# Patient Record
Sex: Male | Born: 1980
Health system: Southern US, Community
[De-identification: ages and names within clinical notes are randomized; demographics above are authoritative.]

## PROBLEM LIST (undated history)

## (undated) DIAGNOSIS — T7840XA Allergy, unspecified, initial encounter: Secondary | ICD-10-CM

## (undated) DIAGNOSIS — J45909 Unspecified asthma, uncomplicated: Secondary | ICD-10-CM

## (undated) HISTORY — DX: Allergy, unspecified, initial encounter: T78.40XA

## (undated) HISTORY — DX: Unspecified asthma, uncomplicated: J45.909

## (undated) HISTORY — PX: WISDOM TOOTH EXTRACTION: SHX21

---

## 2006-03-08 ENCOUNTER — Emergency Department (HOSPITAL_COMMUNITY): Admission: EM | Admit: 2006-03-08 | Discharge: 2006-03-08 | Payer: Self-pay | Admitting: Emergency Medicine

## 2008-09-23 ENCOUNTER — Emergency Department (HOSPITAL_COMMUNITY): Admission: EM | Admit: 2008-09-23 | Discharge: 2008-09-23 | Payer: Self-pay | Admitting: Family Medicine

## 2009-07-07 ENCOUNTER — Emergency Department (HOSPITAL_COMMUNITY): Admission: EM | Admit: 2009-07-07 | Discharge: 2009-07-07 | Payer: Self-pay | Admitting: Family Medicine

## 2009-07-17 ENCOUNTER — Emergency Department (HOSPITAL_COMMUNITY): Admission: EM | Admit: 2009-07-17 | Discharge: 2009-07-17 | Payer: Self-pay | Admitting: Family Medicine

## 2009-09-11 ENCOUNTER — Emergency Department (HOSPITAL_COMMUNITY): Admission: EM | Admit: 2009-09-11 | Discharge: 2009-09-11 | Payer: Self-pay | Admitting: Family Medicine

## 2010-11-30 LAB — GC/CHLAMYDIA PROBE AMP, GENITAL
Chlamydia, DNA Probe: NEGATIVE
GC Probe Amp, Genital: NEGATIVE

## 2010-12-12 LAB — GC/CHLAMYDIA PROBE AMP, GENITAL: GC Probe Amp, Genital: POSITIVE — AB

## 2012-12-30 ENCOUNTER — Ambulatory Visit (INDEPENDENT_AMBULATORY_CARE_PROVIDER_SITE_OTHER): Payer: PRIVATE HEALTH INSURANCE | Admitting: Physician Assistant

## 2012-12-30 VITALS — BP 112/64 | HR 73 | Temp 98.2°F | Resp 14 | Ht 72.5 in | Wt 186.0 lb

## 2012-12-30 DIAGNOSIS — L309 Dermatitis, unspecified: Secondary | ICD-10-CM

## 2012-12-30 DIAGNOSIS — L259 Unspecified contact dermatitis, unspecified cause: Secondary | ICD-10-CM

## 2012-12-30 DIAGNOSIS — J302 Other seasonal allergic rhinitis: Secondary | ICD-10-CM

## 2012-12-30 DIAGNOSIS — J45909 Unspecified asthma, uncomplicated: Secondary | ICD-10-CM

## 2012-12-30 DIAGNOSIS — J3089 Other allergic rhinitis: Secondary | ICD-10-CM

## 2012-12-30 DIAGNOSIS — J309 Allergic rhinitis, unspecified: Secondary | ICD-10-CM

## 2012-12-30 MED ORDER — BECLOMETHASONE DIPROPIONATE 80 MCG/ACT IN AERS: 1.0000 | INHALATION_SPRAY | RESPIRATORY_TRACT | Status: DC | PRN

## 2012-12-30 MED ORDER — FLUTICASONE PROPIONATE 50 MCG/ACT NA SUSP: 2.0000 | Freq: Every day | NASAL | Status: DC

## 2012-12-30 MED ORDER — CLOBETASOL PROPIONATE 0.05 % EX CREA: TOPICAL_CREAM | Freq: Two times a day (BID) | CUTANEOUS | Status: DC

## 2012-12-30 MED ORDER — ALBUTEROL SULFATE HFA 108 (90 BASE) MCG/ACT IN AERS: 2.0000 | INHALATION_SPRAY | RESPIRATORY_TRACT | Status: DC | PRN

## 2012-12-30 MED ORDER — BECLOMETHASONE DIPROPIONATE 80 MCG/ACT IN AERS: 2.0000 | INHALATION_SPRAY | Freq: Two times a day (BID) | RESPIRATORY_TRACT | Status: DC

## 2012-12-30 NOTE — Progress Notes (Signed)
  Subjective:    Patient ID: Isaac Estrada, male    DOB: 11-01-1980, 32 y.o.   MRN: [id number]  HPI This 32 y.o. male presents for evaluation of asthma and a rash. Presents today because his "asthma seemed real heavy today," and he noticed some bumps on his arms x 2 days.  Worsening asthma symptoms x 1 year.  He's been using other people's rescue inhalers as needed.  Needs rescue at least 2 x/week.  Wakes during the night needing rescue at least 1 x/week.  Sometimes "tries not to use it" even though he feels the need. Did not have insurance, so went without care for quite some time.  No new foods, meds, skin products, household products, pets, etc.  One of his daughters has a rash on her arms and legs.  Describes SOB, wheezing and chest tightness.  Past medical history, surgical history, family history, social history and problem list reviewed.  Review of Systems No chest pain, HA, dizziness, vision change, N/V, diarrhea, constipation, dysuria, urinary urgency or frequency, myalgias, arthralgias.     Objective:   Physical Exam Blood pressure 112/64, pulse 73, temperature 98.2 F (36.8 C), temperature source Oral, resp. rate 14, height 6' 0.5" (1.842 m), weight 186 lb (84.369 kg), SpO2 99.00%. Body mass index is 24.87 kg/(m^2). Well-developed, well nourished BM who is awake, alert and oriented, in NAD. HEENT: [place]/AT, PERRL, EOMI.  Sclera and conjunctiva are clear.  EAC are patent, TMs are normal in appearance. Nasal mucosa is pink and moist. OP is clear. Neck: supple, non-tender, no lymphadenopathy, thyromegaly. Heart: RRR, no murmur Lungs: normal effort, CTA Extremities: no cyanosis, clubbing or edema. Skin: warm and dry.  A few very small excoriated lesions on the inner aspect of each arm, including the antecubital fossae. Psychologic: good mood and appropriate affect, normal speech and behavior.  Office Spirometry Results: FEV1: 1.84 liters FVC: 3.95 liters FEV1/FVC: 46.6 % FVC  %  Predicted: 66 liters FEV % Predicted: 38 liters FeF 25-75: 0.98 liters FeF 25-75 % Predicted: 20     Assessment & Plan:  Asthma - Plan: albuterol (PROVENTIL HFA;VENTOLIN HFA) 108 (90 BASE) MCG/ACT inhaler, beclomethasone (QVAR) 80 MCG/ACT inhaler  Perennial allergic rhinitis with seasonal variation - Plan: fluticasone (FLONASE) 50 MCG/ACT nasal spray  Dermatitis - Plan: clobetasol cream (TEMOVATE) 0.05 %  Patient Instructions  It's important to control your allergies to help control your asthma. Please take all the medications as prescribed.     RTC 4 weeks.  Repeat spirometry.   Fernande Bras, PA-C Physician Assistant-Certified [facility]

## 2012-12-30 NOTE — Patient Instructions (Signed)
It's important to control your allergies to help control your asthma. Please take all the medications as prescribed.

## 2013-02-05 ENCOUNTER — Encounter: Payer: Self-pay | Admitting: Physician Assistant

## 2013-05-22 ENCOUNTER — Other Ambulatory Visit: Payer: Self-pay | Admitting: Physician Assistant

## 2013-06-18 ENCOUNTER — Ambulatory Visit (INDEPENDENT_AMBULATORY_CARE_PROVIDER_SITE_OTHER): Payer: PRIVATE HEALTH INSURANCE | Admitting: Family Medicine

## 2013-06-18 ENCOUNTER — Other Ambulatory Visit: Payer: Self-pay | Admitting: Physician Assistant

## 2013-06-18 VITALS — BP 110/72 | HR 80 | Temp 97.6°F | Resp 18 | Ht 72.5 in | Wt 187.0 lb

## 2013-06-18 DIAGNOSIS — J45909 Unspecified asthma, uncomplicated: Secondary | ICD-10-CM

## 2013-06-18 DIAGNOSIS — J45901 Unspecified asthma with (acute) exacerbation: Secondary | ICD-10-CM

## 2013-06-18 MED ORDER — FLUTICASONE-SALMETEROL 100-50 MCG/DOSE IN AEPB: 1.0000 | INHALATION_SPRAY | Freq: Two times a day (BID) | RESPIRATORY_TRACT | Status: DC

## 2013-06-18 MED ORDER — ALBUTEROL SULFATE HFA 108 (90 BASE) MCG/ACT IN AERS: 2.0000 | INHALATION_SPRAY | RESPIRATORY_TRACT | Status: DC | PRN

## 2013-06-18 NOTE — Progress Notes (Signed)
Patient ID: Isaac Estrada, male   DOB: 1980/10/02, 32 y.o.   MRN: [id number]  @[facility] @  Patient ID: Isaac Estrada MRN: [id number], DOB: [birth year], 32 y.o. Date of Encounter: 06/18/2013, 1:08 PM This chart was scribed for Elvina Sidle, MD by Valera Castle, ED Scribe. This patient was seen in room 9 and the patient's care was started at 1:08 PM.   Primary Physician: No PCP Per Patient  Chief Complaint: Asthma  HPI: 32 y.o. year old male with h/o asthma with history below presents with a sudden, moderate asthma flare up, onset earlier this morning. He reports using the last two puffs of his inhaler, with no relief. He reports using a nebulizer afterwards, from his son. He reports he has been using his inhaler every day. He states the attacks usually ocurr in the morning. He reports taking Qvar, with no relief.  He denies any other associated symptoms.   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    Past Medical History  Diagnosis Date   Allergy    Asthma      Home Meds: Prior to Admission medications   Medication Sig Start Date End Date Taking? Authorizing Provider  albuterol (VENTOLIN HFA) 108 (90 BASE) MCG/ACT inhaler Inhale 2 puffs into the lungs every 4 (four) hours as needed for wheezing or shortness of breath. Need office visit for additional refills. 05/22/13  Yes Chelle S Jeffery, PA-C  fluticasone (FLONASE) 50 MCG/ACT nasal spray Place 2 sprays into the nose daily. 12/30/12  Yes Chelle S Jeffery, PA-C  beclomethasone (QVAR) 80 MCG/ACT inhaler Inhale 2 puffs into the lungs 2 (two) times daily. 12/30/12   Chelle S Jeffery, PA-C  clobetasol cream (TEMOVATE) 0.05 % Apply topically 2 (two) times daily. 12/30/12   Chelle Tessa Lerner, PA-C    Allergies: No Known Allergies  History   Social History   Marital Status: Single    Spouse Name: n/a    Number of Children: 5   Years of Education: 14   Occupational History   unemployed    Social History Main Topics    Smoking status: Never Smoker    Smokeless tobacco: Never Used   Alcohol Use: No   Drug Use: No   Sexual Activity: Yes   Other Topics Concern   Not on file   Social History Narrative   Lives with his mother, step-father.  He shares custody of his 5 children.     Review of Systems: Constitutional: negative for chills, fever, night sweats, weight changes, or fatigue  HEENT: negative for vision changes, hearing loss, congestion, rhinorrhea, ST, epistaxis, or sinus pressure Cardiovascular: negative for chest pain or palpitations Respiratory: negative for hemoptysis, wheezing, or cough Positive for SOB Abdominal: negative for abdominal pain, nausea, vomiting, diarrhea, or constipation Dermatological: negative for rash Neurologic: negative for headache, dizziness, or syncope All other systems reviewed and are otherwise negative with the exception to those above and in the HPI.   Physical Exam: Blood pressure 110/72, pulse 80, temperature 97.6 F (36.4 C), temperature source Oral, resp. rate 18, height 6' 0.5" (1.842 m), weight 187 lb (84.823 kg), SpO2 98.00%., Body mass index is 25 kg/(m^2). General: Well developed, well nourished, in no acute distress. Head: Normocephalic, atraumatic, eyes without discharge, sclera non-icteric, nares are without discharge. Bilateral auditory canals clear, TM's are without perforation, pearly grey and translucent with reflective cone of light bilaterally. Oral cavity moist, posterior pharynx without exudate, erythema, peritonsillar abscess, or post nasal drip.  Neck: Supple. No  thyromegaly. Full ROM. No lymphadenopathy. Lungs: Clear bilaterally to auscultation without wheezes, rales, or rhonchi. Breathing is unlabored. Heart: RRR with S1 S2. No murmurs, rubs, or gallops appreciated. Abdomen: Soft, non-tender, non-distended with normoactive bowel sounds. No hepatomegaly. No rebound/guarding. No obvious abdominal masses. Msk:  Strength and tone normal  for age. Extremities/Skin: Warm and dry. No clubbing or cyanosis. No edema. No rashes or suspicious lesions. Neuro: Alert and oriented X 3. Moves all extremities spontaneously. Gait is normal. CNII-XII grossly in tact. Psych:  Responds to questions appropriately with a normal affect.   Labs: No orders of the defined types were placed in this encounter.     ASSESSMENT AND PLAN:  32 y.o. year old male with Asthma with acute exacerbation - Plan: Fluticasone-Salmeterol (ADVAIR) 100-50 MCG/DOSE AEPB, albuterol (VENTOLIN HFA) 108 (90 BASE) MCG/ACT inhaler     Signed, Elvina Sidle, MD 06/18/2013 1:08 PM

## 2013-06-18 NOTE — Patient Instructions (Signed)

## 2013-06-18 NOTE — Progress Notes (Signed)
Is a 32 year old gentleman with long history of asthma. He's using his inhaler every day now and it's not working as of last night and this morning. He's never taken Advair but he has failed the Qvar treatment. Patient does car detailing.  Over the course of the morning his torso breath has improved.  Objective: No acute distress Few expiratory wheezes HEENT unremarkable  Assessment: Chronic persistent asthma, acute exacerbation today Plan:Asthma with acute exacerbation - Plan: Fluticasone-Salmeterol (ADVAIR) 100-50 MCG/DOSE AEPB, albuterol (VENTOLIN HFA) 108 (90 BASE) MCG/ACT inhaler  Signed, Elvina Sidle, MD

## 2014-06-29 ENCOUNTER — Other Ambulatory Visit: Payer: Self-pay | Admitting: Family Medicine

## 2014-12-31 ENCOUNTER — Other Ambulatory Visit: Payer: Self-pay | Admitting: Physician Assistant

## 2015-01-24 ENCOUNTER — Encounter: Payer: Self-pay | Admitting: Emergency Medicine

## 2015-01-24 ENCOUNTER — Emergency Department
Admission: EM | Admit: 2015-01-24 | Discharge: 2015-01-24 | Disposition: A | Discharge status: Home / Self Care | Payer: Self-pay | Source: Home / Self Care | Attending: Emergency Medicine | Admitting: Emergency Medicine

## 2015-01-24 DIAGNOSIS — J4521 Mild intermittent asthma with (acute) exacerbation: Secondary | ICD-10-CM

## 2015-01-24 MED ORDER — ALBUTEROL SULFATE HFA 108 (90 BASE) MCG/ACT IN AERS: 2.0000 | INHALATION_SPRAY | Freq: Four times a day (QID) | RESPIRATORY_TRACT | Status: DC | PRN

## 2015-01-24 MED ORDER — ALBUTEROL SULFATE HFA 108 (90 BASE) MCG/ACT IN AERS: INHALATION_SPRAY | RESPIRATORY_TRACT | Status: DC

## 2015-01-24 NOTE — ED Notes (Signed)
Password for STD follow up [place] 5

## 2015-01-24 NOTE — ED Notes (Signed)
Patient states he is out of his asthma inhaler and request to be tested for STD's

## 2015-01-24 NOTE — ED Provider Notes (Signed)
CSN: [id number]     Arrival date & time 01/24/15  1241 History   First MD Initiated Contact with Patient 01/24/15 1329     Chief Complaint  Patient presents with  . Asthma  . SEXUALLY TRANSMITTED DISEASE   (Consider location/radiation/quality/duration/timing/severity/associated sxs/prior Treatment) Patient is a 34 y.o. male presenting with asthma. The history is provided by the patient. No language interpreter was used.  Asthma This is a recurrent problem. The current episode started more than 1 week ago. The problem occurs constantly. The problem has not changed since onset.Pertinent negatives include no chest pain. Nothing aggravates the symptoms. Nothing relieves the symptoms. He has tried nothing for the symptoms.  Pt is out of albuterol.   Pt also request test for gc and ct.   Pt had condom break.   He has no discharge but has had some tingling  Past Medical History  Diagnosis Date  . Allergy   . Asthma    History reviewed. No pertinent past surgical history. Family History  Problem Relation Age of Onset  . Diabetes Mother   . Asthma Daughter   . Asthma Son   . Asthma Daughter    History  Substance Use Topics  . Smoking status: Never Smoker   . Smokeless tobacco: Never Used  . Alcohol Use: No    Review of Systems  Cardiovascular: Negative for chest pain.  All other systems reviewed and are negative.   Allergies  Review of patient's allergies indicates no known allergies.  Home Medications   Prior to Admission medications   Medication Sig Start Date End Date Taking? Authorizing Provider  albuterol (VENTOLIN HFA) 108 (90 BASE) MCG/ACT inhaler INHALE 2 PUFFS INTO THE LUNGS EVERY 4 HOURS AS NEEDED FOR WHEEZING OR SHORTNESS OF BREATH...    "NEEDS OFFICE VISIT FOR ADDITIONAL REFILLS" 06/29/14   Porfirio Oarhelle Jeffery, PA-C  beclomethasone (QVAR) 80 MCG/ACT inhaler Inhale 2 puffs into the lungs 2 (two) times daily. 12/30/12   Chelle Jeffery, PA-C  clobetasol cream (TEMOVATE) 0.05 %  Apply topically 2 (two) times daily. 12/30/12   Chelle Jeffery, PA-C  fluticasone (FLONASE) 50 MCG/ACT nasal spray Place 2 sprays into the nose daily. 12/30/12   Chelle Jeffery, PA-C  Fluticasone-Salmeterol (ADVAIR) 100-50 MCG/DOSE AEPB Inhale 1 puff into the lungs 2 (two) times daily. 06/18/13   Elvina SidleKurt Lauenstein, MD   BP 143/76 mmHg  Pulse 87  Temp(Src) 98 F (36.7 C) (Oral)  Resp 16  Ht 6' (1.829 m)  Wt 181 lb 12 oz (82.441 kg)  BMI 24.64 kg/m2  SpO2 99% Physical Exam  Constitutional: He is oriented to person, place, and time. He appears well-developed and well-nourished.  HENT:  Head: Normocephalic.  Eyes: Conjunctivae and EOM are normal. Pupils are equal, round, and reactive to light.  Neck: Normal range of motion.  Cardiovascular: Normal rate.   Pulmonary/Chest: Effort normal and breath sounds normal.  Abdominal: He exhibits no distension.  Musculoskeletal: Normal range of motion.  Neurological: He is alert and oriented to person, place, and time.  Psychiatric: He has a normal mood and affect.  Nursing note and vitals reviewed.   ED Course  Procedures (including critical care time) Labs Review Labs Reviewed  URINE CYTOLOGY ANCILLARY ONLY    Imaging Review No results found.   MDM   1. Asthma with acute exacerbation, mild intermittent   albuterol Will hold on std treatment pending results. AVS    Elson AreasLeslie K Jakhai Fant, PA-C 01/24/15 1336  Lonia SkinnerLeslie K TopekaSofia, PA-C 01/24/15  1337 

## 2015-01-24 NOTE — Discharge Instructions (Signed)

## 2015-01-26 LAB — GC/CHLAMYDIA PROBE AMP, URINE
CHLAMYDIA, SWAB/URINE, PCR: NEGATIVE
GC PROBE AMP, URINE: NEGATIVE

## 2015-01-27 ENCOUNTER — Telehealth: Payer: Self-pay | Admitting: Emergency Medicine

## 2015-08-20 ENCOUNTER — Other Ambulatory Visit: Payer: Self-pay | Admitting: Physician Assistant

## 2015-11-11 ENCOUNTER — Ambulatory Visit (INDEPENDENT_AMBULATORY_CARE_PROVIDER_SITE_OTHER): Payer: Self-pay | Admitting: Family Medicine

## 2015-11-11 VITALS — BP 118/78 | HR 81 | Temp 98.8°F | Ht 72.5 in | Wt 169.0 lb

## 2015-11-11 DIAGNOSIS — J4540 Moderate persistent asthma, uncomplicated: Secondary | ICD-10-CM

## 2015-11-11 MED ORDER — FLUTICASONE PROPIONATE HFA 110 MCG/ACT IN AERO: 2.0000 | INHALATION_SPRAY | Freq: Two times a day (BID) | RESPIRATORY_TRACT | Status: DC

## 2015-11-11 MED ORDER — ALBUTEROL SULFATE HFA 108 (90 BASE) MCG/ACT IN AERS: INHALATION_SPRAY | RESPIRATORY_TRACT | Status: DC

## 2015-11-11 NOTE — Progress Notes (Signed)
   Subjective:    Patient ID: Isaac Estrada, male    DOB: 02-13-1981, 35 y.o.   MRN: [id number]  HPI This is a pleasant 35 yo male who presents today for refill of his asthma medication. He has been out of long acting and has been using his mother's up to several times a day. Has had asthma since childhood.   Past Medical History  Diagnosis Date  . Allergy   . Asthma    No past surgical history on file. Family History  Problem Relation Age of Onset  . Diabetes Mother   . Asthma Daughter   . Asthma Son   . Asthma Daughter    Social History  Substance Use Topics  . Smoking status: Never Smoker   . Smokeless tobacco: Never Used  . Alcohol Use: No    Review of Systems No fever, no chills, no cough, occasional wheeze and chest tightness    Objective:   Physical Exam Physical Exam  Constitutional: Oriented to person, place, and time. He appears well-developed and well-nourished.  HENT:  Head: Normocephalic and atraumatic.  Eyes: Conjunctivae are normal.  Neck: Normal range of motion. Neck supple.  Cardiovascular: Normal rate, regular rhythm and normal heart sounds.   Pulmonary/Chest: Effort normal and breath sounds normal.  Musculoskeletal: Normal range of motion.  Neurological: Alert and oriented to person, place, and time.  Skin: Skin is warm and dry.  Psychiatric: Normal mood and affect. Behavior is normal. Judgment and thought content normal.  Vitals reviewed.  BP 118/78 mmHg  Pulse 81  Temp(Src) 98.8 F (37.1 C) (Oral)  Ht 6' 0.5" (1.842 m)  Wt 169 lb (76.658 kg)  BMI 22.59 kg/m2  SpO2 98%  PF 450 best, 70% predicted (648)     Assessment & Plan:  1. Asthma, moderate persistent, uncomplicated - Provided written and verbal information regarding diagnosis and treatment. - discussed importance of decreasing lung inflammation and having better control and asthma - fluticasone (FLOVENT HFA) 110 MCG/ACT inhaler; Inhale 2 puffs into the lungs 2 (two) times daily.   Dispense: 1 Inhaler; Refill: 12 - albuterol (VENTOLIN HFA) 108 (90 Base) MCG/ACT inhaler; INHALE 2 PUFFS INTO THE LUNGS EVERY 4 HOURS AS NEEDED FOR WHEEZING OR SHORTNESS OF BREATH  Dispense: 18 g; Refill: 3 - follow up 4-8 weeks, sooner if worsening symptoms  Olean Reeeborah Gessner, FNP-BC  [facility]  11/11/2015 10:51 AM

## 2015-11-11 NOTE — Patient Instructions (Addendum)
IF you received an x-ray today, you will receive an invoice from [facility]. Please contact [facility] at [phone] with questions or concerns regarding your invoice.   IF you received labwork today, you will receive an invoice from [facility]. Please contact [facility] at [phone] with questions or concerns regarding your invoice.   Our billing staff will not be able to assist you with questions regarding bills from these companies.  You will be contacted with the lab results as soon as they are available. The fastest way to get your results is to activate your My Chart account. Instructions are located on the last page of this paperwork. If you have not heard from us regarding the results in 2 weeks, please contact this office.  Please come in for an asthma recheck in 4-8 weeks, sooner if worsening symptoms  Asthma, Adult Asthma is a recurring condition in which the airways tighten and narrow. Asthma can make it difficult to breathe. It can cause coughing, wheezing, and shortness of breath. Asthma episodes, also called asthma attacks, range from minor to life-threatening. Asthma cannot be cured, but medicines and lifestyle changes can help control it. CAUSES Asthma is believed to be caused by inherited (genetic) and environmental factors, but its exact cause is unknown. Asthma may be triggered by allergens, lung infections, or irritants in the air. Asthma triggers are different for each person. Common triggers include:   Animal dander.  Dust mites.  Cockroaches.  Pollen from trees or grass.  Mold.  Smoke.  Air pollutants such as dust, household cleaners, hair sprays, aerosol sprays, paint fumes, strong chemicals, or strong odors.  Cold air, weather changes, and winds (which increase molds and pollens in the air).  Strong emotional expressions such as crying or laughing hard.  Stress.  Certain medicines (such as aspirin) or types  of drugs (such as beta-blockers).  Sulfites in foods and drinks. Foods and drinks that may contain sulfites include dried fruit, potato chips, and sparkling grape juice.  Infections or inflammatory conditions such as the flu, a cold, or an inflammation of the nasal membranes (rhinitis).  Gastroesophageal reflux disease (GERD).  Exercise or strenuous activity. SYMPTOMS Symptoms may occur immediately after asthma is triggered or many hours later. Symptoms include:  Wheezing.  Excessive nighttime or early morning coughing.  Frequent or severe coughing with a common cold.  Chest tightness.  Shortness of breath. DIAGNOSIS  The diagnosis of asthma is made by a review of your medical history and a physical exam. Tests may also be performed. These may include:  Lung function studies. These tests show how much air you breathe in and out.  Allergy tests.  Imaging tests such as X-rays. TREATMENT  Asthma cannot be cured, but it can usually be controlled. Treatment involves identifying and avoiding your asthma triggers. It also involves medicines. There are 2 classes of medicine used for asthma treatment:   Controller medicines. These prevent asthma symptoms from occurring. They are usually taken every day.  Reliever or rescue medicines. These quickly relieve asthma symptoms. They are used as needed and provide short-term relief. Your health care provider will help you create an asthma action plan. An asthma action plan is a written plan for managing and treating your asthma attacks. It includes a list of your asthma triggers and how they may be avoided. It also includes information on when medicines should be taken and when their dosage should be changed. An action plan may also involve the use of  a device called a peak flow meter. A peak flow meter measures how well the lungs are working. It helps you monitor your condition. HOME CARE INSTRUCTIONS   Take medicines only as directed by your  health care provider. Speak with your health care provider if you have questions about how or when to take the medicines.  Use a peak flow meter as directed by your health care provider. Record and keep track of readings.  Understand and use the action plan to help minimize or stop an asthma attack without needing to seek medical care.  Control your home environment in the following ways to help prevent asthma attacks:  Do not smoke. Avoid being exposed to secondhand smoke.  Change your heating and air conditioning filter regularly.  Limit your use of fireplaces and wood stoves.  Get rid of pests (such as roaches and mice) and their droppings.  Throw away plants if you see mold on them.  Clean your floors and dust regularly. Use unscented cleaning products.  Try to have someone else vacuum for you regularly. Stay out of rooms while they are being vacuumed and for a short while afterward. If you vacuum, use a dust mask from a hardware store, a double-layered or microfilter vacuum cleaner bag, or a vacuum cleaner with a HEPA filter.  Replace carpet with wood, tile, or vinyl flooring. Carpet can trap dander and dust.  Use allergy-proof pillows, mattress covers, and box spring covers.  Wash bed sheets and blankets every week in hot water and dry them in a dryer.  Use blankets that are made of polyester or cotton.  Clean bathrooms and kitchens with bleach. If possible, have someone repaint the walls in these rooms with mold-resistant paint. Keep out of the rooms that are being cleaned and painted.  Wash hands frequently. SEEK MEDICAL CARE IF:   You have wheezing, shortness of breath, or a cough even if taking medicine to prevent attacks.  The colored mucus you cough up (sputum) is thicker than usual.  Your sputum changes from clear or white to yellow, green, gray, or bloody.  You have any problems that may be related to the medicines you are taking (such as a rash, itching,  swelling, or trouble breathing).  You are using a reliever medicine more than 2-3 times per week.  Your peak flow is still at 50-79% of your personal best after following your action plan for 1 hour.  You have a fever. SEEK IMMEDIATE MEDICAL CARE IF:   You seem to be getting worse and are unresponsive to treatment during an asthma attack.  You are short of breath even at rest.  You get short of breath when doing very little physical activity.  You have difficulty eating, drinking, or talking due to asthma symptoms.  You develop chest pain.  You develop a fast heartbeat.  You have a bluish color to your lips or fingernails.  You are light-headed, dizzy, or faint.  Your peak flow is less than 50% of your personal best.   This information is not intended to replace advice given to you by your health care provider. Make sure you discuss any questions you have with your health care provider.   Document Released: 08/14/2005 Document Revised: 05/05/2015 Document Reviewed: 03/13/2013 Elsevier Interactive Patient Education [employer]

## 2015-11-27 ENCOUNTER — Encounter (HOSPITAL_COMMUNITY): Payer: Self-pay | Admitting: Physical Medicine and Rehabilitation

## 2015-11-27 ENCOUNTER — Emergency Department (HOSPITAL_COMMUNITY)
Admission: EM | Admit: 2015-11-27 | Discharge: 2015-11-27 | Disposition: A | Discharge status: Home / Self Care | Payer: BLUE CROSS/BLUE SHIELD | Attending: Emergency Medicine | Admitting: Emergency Medicine

## 2015-11-27 DIAGNOSIS — Z79899 Other long term (current) drug therapy: Secondary | ICD-10-CM | POA: Insufficient documentation

## 2015-11-27 DIAGNOSIS — H05223 Edema of bilateral orbit: Secondary | ICD-10-CM | POA: Diagnosis not present

## 2015-11-27 DIAGNOSIS — J45909 Unspecified asthma, uncomplicated: Secondary | ICD-10-CM | POA: Insufficient documentation

## 2015-11-27 DIAGNOSIS — T361X5A Adverse effect of cephalosporins and other beta-lactam antibiotics, initial encounter: Secondary | ICD-10-CM | POA: Insufficient documentation

## 2015-11-27 DIAGNOSIS — T7840XA Allergy, unspecified, initial encounter: Secondary | ICD-10-CM | POA: Diagnosis present

## 2015-11-27 MED ORDER — DOXYCYCLINE HYCLATE 100 MG PO CAPS: 100.0000 mg | ORAL_CAPSULE | Freq: Two times a day (BID) | ORAL | Status: DC

## 2015-11-27 MED ORDER — DIPHENHYDRAMINE HCL 25 MG PO CAPS
25.0000 mg | ORAL_CAPSULE | Freq: Once | ORAL | Status: AC
  Administered 2015-11-27: 25 mg via ORAL
  Filled 2015-11-27: qty 1

## 2015-11-27 MED ORDER — PREDNISONE 50 MG PO TABS: ORAL_TABLET | ORAL | Status: DC

## 2015-11-27 MED ORDER — PREDNISONE 20 MG PO TABS
60.0000 mg | ORAL_TABLET | Freq: Once | ORAL | Status: AC
  Administered 2015-11-27: 60 mg via ORAL
  Filled 2015-11-27: qty 3

## 2015-11-27 NOTE — Discharge Instructions (Signed)
Discontinue current antibiotic and begin taking one prescribed today. Contact the provider the performed your hair transplant and inform them of your reaction and that you will be switching your antibiotic. Take Benadryl as needed. Take prednisone as prescribed. Return to the emergency department if you experience severe worsening of her symptoms, increased swelling, swelling of your lip or your tongue, difficulty breathing or swallowing, spread of rash.

## 2015-11-27 NOTE — ED Notes (Signed)
Declined W/C at D/C and was escorted to lobby by RN. 

## 2015-11-27 NOTE — ED Provider Notes (Signed)
CSN: [id number]     Arrival date & time 11/27/15  0957 History  By signing my name below, I, Rohini Rajnarayanan, attest that this documentation has been prepared under the direction and in the presence of Gaylyn Rong PA-C Electronically Signed: Charlean Merl, ED Scribe 11/27/2015 at 10:08 AM.   Chief Complaint  Patient presents with  . Allergic Reaction   The history is provided by the patient. No language interpreter was used.   HPI Comments: Isaac Estrada is a 35 y.o. male with a pmhx of allergic reactions and asthma, who presents to the Emergency Department complaining of a possible allergic reaction with associated swelling to the face and eyes that began two days ago after beginning a new course of abx, and has worsened since then. Pt denies any swelling to the lips or throat. Pt had a hair transplant 3 days ago, and began taking Keflex two days ago. Pt noticed the swelling later that day. Pt reports that swelling reduced after taking benadryl last night. Pt did not take his abx today or last night to see if sx would reduce. He does not normally take any abx and is unsure whether he could be allergic to the abx that he has been prescribed currently. Pt denies any respiratory difficulty, pain to the area, or itchiness. Pt has not used any new lotions or perfumes recently.   Past Medical History  Diagnosis Date  . Allergy   . Asthma    History reviewed. No pertinent past surgical history. Family History  Problem Relation Age of Onset  . Diabetes Mother   . Asthma Daughter   . Asthma Son   . Asthma Daughter    Social History  Substance Use Topics  . Smoking status: Never Smoker   . Smokeless tobacco: Never Used  . Alcohol Use: No    Review of Systems  All other systems reviewed and are negative.  10 Systems reviewed and all are negative for acute change except as noted in the HPI.  Allergies  Review of patient's allergies indicates no known allergies.  Home  Medications   Prior to Admission medications   Medication Sig Start Date End Date Taking? Authorizing Provider  albuterol (VENTOLIN HFA) 108 (90 Base) MCG/ACT inhaler INHALE 2 PUFFS INTO THE LUNGS EVERY 4 HOURS AS NEEDED FOR WHEEZING OR SHORTNESS OF BREATH 11/11/15   Emi Belfast, FNP  beclomethasone (QVAR) 80 MCG/ACT inhaler Inhale 2 puffs into the lungs 2 (two) times daily. Patient not taking: Reported on 11/11/2015 12/30/12   Porfirio Oar, PA-C  clobetasol cream (TEMOVATE) 0.05 % Apply topically 2 (two) times daily. Patient not taking: Reported on 11/11/2015 12/30/12   Chelle Jeffery, PA-C  fluticasone (FLONASE) 50 MCG/ACT nasal spray Place 2 sprays into the nose daily. Patient not taking: Reported on 11/11/2015 12/30/12   Chelle Jeffery, PA-C  fluticasone (FLOVENT HFA) 110 MCG/ACT inhaler Inhale 2 puffs into the lungs 2 (two) times daily. 11/11/15   Emi Belfast, FNP  Fluticasone-Salmeterol (ADVAIR) 100-50 MCG/DOSE AEPB Inhale 1 puff into the lungs 2 (two) times daily. Patient not taking: Reported on 11/11/2015 06/18/13   Elvina Sidle, MD   Blood pressure 133/85, pulse 71, temperature 97.5 F (36.4 C), temperature source Oral, resp. rate 14, SpO2 99 %.  Physical Exam  Constitutional: He is oriented to person, place, and time. He appears well-developed and well-nourished. No distress.  HENT:  Head: Normocephalic and atraumatic.  No lip or tongue swelling.  Eyes: Conjunctivae and EOM are  normal. Right eye exhibits no discharge. Left eye exhibits no discharge. No scleral icterus.  Mild b/l periorbital edema. Normal conjunctiva. No pain with eye movement. EOM intact. No redness or warmth to the area. No surrounding cellulitis.   Cardiovascular: Normal rate.   Pulmonary/Chest: Effort normal.  Speaking in complete sentences. Airway intact.  Neurological: He is alert and oriented to person, place, and time. Coordination normal.  Skin: Skin is warm and dry. No rash noted. He is not  diaphoretic. No erythema. No pallor.  No other rash noted.   Psychiatric: He has a normal mood and affect. His behavior is normal.  Nursing note and vitals reviewed.   ED Course  Procedures  DIAGNOSTIC STUDIES: Oxygen Saturation is 99% on RA, normal by my interpretation.    COORDINATION OF CARE: 10:13 AM-Discussed treatment plan which includes Benadryl and prednisone with pt at bedside and pt agreed to plan.   Labs Review Labs Reviewed - No data to display  Imaging Review No results found. I have personally reviewed and evaluated these images and lab results as part of my medical decision-making.   EKG Interpretation None      MDM   Final diagnoses:  Allergic reaction, initial encounter   Otherwise healthy 35 year old male presents to the ED with likely allergic reaction to abx. Pt had hair transplant 3 days ago. He started taking Keflex 2 days ago and subsequently developed periorbital swelling and itching. Pt speaking in complete sentences. Airway intact. No oral swelling. No other rash noted. No sign of infection surrounding hair transplantation. Recommend d/c Keflex. Will switch to doxycycline. Pt given benadryl and prednisone in ED. Recommend contacting physician who performed hair transplant procedure and notifying them of symptoms and change in medication. STRICT return precautions given.   I personally performed the services described in this documentation, which was scribed in my presence. The recorded information has been reviewed and is accurate.       Lester KinsmanSamantha Tripp AjoDowless, PA-C 11/28/15 0900  Nelva Nayobert Beaton, MD 12/02/15 [phone]

## 2015-11-27 NOTE — ED Notes (Signed)
Pt presents to department for evaluation of possible allergic reaction. Started taking Keflex on Thursday after hair transplant. Now reports swelling to face and eyes. Denies respiratory issues.

## 2016-06-14 ENCOUNTER — Other Ambulatory Visit: Payer: Self-pay | Admitting: Family Medicine

## 2016-06-14 DIAGNOSIS — J4540 Moderate persistent asthma, uncomplicated: Secondary | ICD-10-CM

## 2016-07-24 ENCOUNTER — Ambulatory Visit (INDEPENDENT_AMBULATORY_CARE_PROVIDER_SITE_OTHER): Payer: Self-pay | Admitting: Family Medicine

## 2016-07-24 VITALS — BP 126/80 | HR 81 | Temp 98.2°F | Resp 17 | Ht 73.5 in | Wt 184.0 lb

## 2016-07-24 DIAGNOSIS — J45909 Unspecified asthma, uncomplicated: Secondary | ICD-10-CM

## 2016-07-24 MED ORDER — ALBUTEROL SULFATE 108 (90 BASE) MCG/ACT IN AEPB: 1.0000 | INHALATION_SPRAY | RESPIRATORY_TRACT | 0 refills | Status: DC | PRN

## 2016-07-24 MED ORDER — BUDESONIDE-FORMOTEROL FUMARATE 80-4.5 MCG/ACT IN AERO: 2.0000 | INHALATION_SPRAY | Freq: Two times a day (BID) | RESPIRATORY_TRACT | 3 refills | Status: DC

## 2016-07-24 NOTE — Progress Notes (Signed)
Subjective:  By signing my name below, I, Isaac Estrada, attest that this documentation has been prepared under the direction and in the presence of Isaac FloodJeffrey R Sharlie Shreffler, MD Electronically Signed: Charline BillsEssence Estrada, ED Scribe 07/24/2016 at 12:29 PM.   Patient ID: Isaac MoulderSamuel Estrada, male    DOB: [birth year], 35 y.o.   MRN: [id number] Chief Complaint  Patient presents with  . Asthma   HPI HPI Comments: Isaac Estrada is a 35 y.o. male, with a h/o asthma, who presents to the [facility] for a follow-up for asthma. Pt was diagnosed with asthma as child and has had been to the ED once due to asthma exacerbation but never hospitalized. He states that he occasionally has to use albuterol at night and wakes up in the morning with wheezing. Pt states that he has been out of his albuterol for the past week but purchased an OTC inhaler. Pt takes Flovent daily and Ventolin every other day. Pt is a nonsmoker. His daughter has a non-shedding dog that has not exacerbated his asthma. He has tried Advair in the past but states this did not improve his asthma.  Patient Active Problem List   Diagnosis Date Noted  . Asthma 12/30/2012  . Perennial allergic rhinitis with seasonal variation 12/30/2012   Past Medical History:  Diagnosis Date  . Allergy   . Asthma    No past surgical history on file. No Known Allergies Prior to Admission medications   Medication Sig Start Date End Date Taking? Authorizing Provider  albuterol (VENTOLIN HFA) 108 (90 Base) MCG/ACT inhaler INHALE 2 PUFFS INTO THE LUNGS EVERY 4 HOURS AS NEEDED FOR WHEEZING OR SHORTNESS OF BREATH 11/11/15  Yes Emi Belfasteborah B Gessner, FNP  fluticasone (FLOVENT HFA) 110 MCG/ACT inhaler Inhale 2 puffs into the lungs 2 (two) times daily. 11/11/15  Yes Emi Belfasteborah B Gessner, FNP   Social History   Social History  . Marital status: Single    Spouse name: n/a  . Number of children: 5  . Years of education: 14   Occupational History  . unemployed     Social History Main Topics  . Smoking status: Never Smoker  . Smokeless tobacco: Never Used  . Alcohol use No  . Drug use: No  . Sexual activity: Yes   Other Topics Concern  . Not on file   Social History Narrative   Lives with his mother, step-father.  He shares custody of his 5 children.   Review of Systems  Respiratory: Positive for wheezing.       Objective:   Physical Exam  Constitutional: He is oriented to person, place, and time. He appears well-developed and well-nourished.  HENT:  Head: Normocephalic and atraumatic.  Right Ear: Tympanic membrane, external ear and ear canal normal.  Left Ear: Tympanic membrane, external ear and ear canal normal.  Nose: No rhinorrhea.  Mouth/Throat: Oropharynx is clear and moist and mucous membranes are normal. No oropharyngeal exudate or posterior oropharyngeal erythema.  Eyes: Conjunctivae are normal. Pupils are equal, round, and reactive to light.  Neck: Neck supple.  Cardiovascular: Normal rate, regular rhythm, normal heart sounds and intact distal pulses.   No murmur heard. Pulmonary/Chest: Effort normal and breath sounds normal. He has no wheezes. He has no rhonchi. He has no rales.  Abdominal: Soft. There is no tenderness.  Lymphadenopathy:    He has no cervical adenopathy.  Neurological: He is alert and oriented to person, place, and time.  Skin: Skin is warm and dry.  No rash noted.  Psychiatric: He has a normal mood and affect. His behavior is normal.  Vitals reviewed.    Vitals:   07/24/16 1207  BP: 126/80  Pulse: 81  Resp: 17  Temp: 98.2 F (36.8 C)  TempSrc: Oral  SpO2: 96%  Weight: 184 lb (83.5 kg)  Height: 6' 1.5" (1.867 m)      Assessment & Plan:    Isaac MoulderSamuel Estrada is a 35 y.o. male Moderate asthma, unspecified whether complicated, unspecified whether persistent - Plan: Albuterol Sulfate (PROAIR RESPICLICK) 108 (90 Base) MCG/ACT AEPB, budesonide-formoterol (SYMBICORT) 80-4.5 MCG/ACT  inhaler   -Incomplete control based on frequency of albuterol use. Did not experience significant relief with plain Flovent.   -Start Symbicort 80/4.52 puffs twice a day. Continue albuterol with Proair respiclick as needed for breakthrough sx's. Recheck in 3 months, or sooner if not improving symptoms. RTC precautions given.  Meds ordered this encounter  Medications  . Albuterol Sulfate (PROAIR RESPICLICK) 108 (90 Base) MCG/ACT AEPB    Sig: Inhale 1 Inhaler into the lungs every 4 (four) hours as needed.    Dispense:  1 each    Refill:  0  . budesonide-formoterol (SYMBICORT) 80-4.5 MCG/ACT inhaler    Sig: Inhale 2 puffs into the lungs 2 (two) times daily.    Dispense:  1 Inhaler    Refill:  3   Patient Instructions   Start Symbicort 2 puffs twice per day. Okay to still use albuterol inhaler up to every 4 hours if needed for wheezing. The other inhaler should lessen the frequency of use of albuterol. Follow-up with me in 3 months, sooner if worsening before that time.   Asthma, Adult Asthma is a recurring condition in which the airways tighten and narrow. Asthma can make it difficult to breathe. It can cause coughing, wheezing, and shortness of breath. Asthma episodes, also called asthma attacks, range from minor to life-threatening. Asthma cannot be cured, but medicines and lifestyle changes can help control it. What are the causes? Asthma is believed to be caused by inherited (genetic) and environmental factors, but its exact cause is unknown. Asthma may be triggered by allergens, lung infections, or irritants in the air. Asthma triggers are different for each person. Common triggers include:  Animal dander.  Dust mites.  Cockroaches.  Pollen from trees or grass.  Mold.  Smoke.  Air pollutants such as dust, household cleaners, hair sprays, aerosol sprays, paint fumes, strong chemicals, or strong odors.  Cold air, weather changes, and winds (which increase molds and pollens in  the air).  Strong emotional expressions such as crying or laughing hard.  Stress.  Certain medicines (such as aspirin) or types of drugs (such as beta-blockers).  Sulfites in foods and drinks. Foods and drinks that may contain sulfites include dried fruit, potato chips, and sparkling grape juice.  Infections or inflammatory conditions such as the flu, a cold, or an inflammation of the nasal membranes (rhinitis).  Gastroesophageal reflux disease (GERD).  Exercise or strenuous activity. What are the signs or symptoms? Symptoms may occur immediately after asthma is triggered or many hours later. Symptoms include:  Wheezing.  Excessive nighttime or early morning coughing.  Frequent or severe coughing with a common cold.  Chest tightness.  Shortness of breath. How is this diagnosed? The diagnosis of asthma is made by a review of your medical history and a physical exam. Tests may also be performed. These may include:  Lung function studies. These tests show how much air you  breathe in and out.  Allergy tests.  Imaging tests such as X-rays. How is this treated? Asthma cannot be cured, but it can usually be controlled. Treatment involves identifying and avoiding your asthma triggers. It also involves medicines. There are 2 classes of medicine used for asthma treatment:  Controller medicines. These prevent asthma symptoms from occurring. They are usually taken every day.  Reliever or rescue medicines. These quickly relieve asthma symptoms. They are used as needed and provide short-term relief. Your health care provider will help you create an asthma action plan. An asthma action plan is a written plan for managing and treating your asthma attacks. It includes a list of your asthma triggers and how they may be avoided. It also includes information on when medicines should be taken and when their dosage should be changed. An action plan may also involve the use of a device called a  peak flow meter. A peak flow meter measures how well the lungs are working. It helps you monitor your condition. Follow these instructions at home:  Take medicines only as directed by your health care provider. Speak with your health care provider if you have questions about how or when to take the medicines.  Use a peak flow meter as directed by your health care provider. Record and keep track of readings.  Understand and use the action plan to help minimize or stop an asthma attack without needing to seek medical care.  Control your home environment in the following ways to help prevent asthma attacks:  Do not smoke. Avoid being exposed to secondhand smoke.  Change your heating and air conditioning filter regularly.  Limit your use of fireplaces and wood stoves.  Get rid of pests (such as roaches and mice) and their droppings.  Throw away plants if you see mold on them.  Clean your floors and dust regularly. Use unscented cleaning products.  Try to have someone else vacuum for you regularly. Stay out of rooms while they are being vacuumed and for a short while afterward. If you vacuum, use a dust mask from a hardware store, a double-layered or microfilter vacuum cleaner bag, or a vacuum cleaner with a HEPA filter.  Replace carpet with wood, tile, or vinyl flooring. Carpet can trap dander and dust.  Use allergy-proof pillows, mattress covers, and box spring covers.  Wash bed sheets and blankets every week in hot water and dry them in a dryer.  Use blankets that are made of polyester or cotton.  Clean bathrooms and kitchens with bleach. If possible, have someone repaint the walls in these rooms with mold-resistant paint. Keep out of the rooms that are being cleaned and painted.  Wash hands frequently. Contact a health care provider if:  You have wheezing, shortness of breath, or a cough even if taking medicine to prevent attacks.  The colored mucus you cough up (sputum) is  thicker than usual.  Your sputum changes from clear or white to yellow, green, gray, or bloody.  You have any problems that may be related to the medicines you are taking (such as a rash, itching, swelling, or trouble breathing).  You are using a reliever medicine more than 2-3 times per week.  Your peak flow is still at 50-79% of your personal best after following your action plan for 1 hour.  You have a fever. Get help right away if:  You seem to be getting worse and are unresponsive to treatment during an asthma attack.  You are short  of breath even at rest.  You get short of breath when doing very little physical activity.  You have difficulty eating, drinking, or talking due to asthma symptoms.  You develop chest pain.  You develop a fast heartbeat.  You have a bluish color to your lips or fingernails.  You are light-headed, dizzy, or faint.  Your peak flow is less than 50% of your personal best. This information is not intended to replace advice given to you by your health care provider. Make sure you discuss any questions you have with your health care provider. Document Released: 08/14/2005 Document Revised: 01/26/2016 Document Reviewed: 03/13/2013 Elsevier Interactive Patient Education  2017 [employer].   IF you received an x-ray today, you will receive an invoice from [facility]. Please contact [facility] at [phone] with questions or concerns regarding your invoice.   IF you received labwork today, you will receive an invoice from [employer]. Please contact [facility] at [phone] with questions or concerns regarding your invoice.   Our billing staff will not be able to assist you with questions regarding Estrada from these companies.  You will be contacted with the lab results as soon as they are available. The fastest way to get your results is to activate your My Chart account. Instructions are located on the  last page of this paperwork. If you have not heard from [place] regarding the results in 2 weeks, please contact this office.        I personally performed the services described in this documentation, which was scribed in my presence. The recorded information has been reviewed and considered, and addended by me as needed.   Signed,   Meredith Staggers, MD [facility].  07/24/16 2:12 PM

## 2016-07-24 NOTE — Patient Instructions (Addendum)
Start Symbicort 2 puffs twice per day. Okay to still use albuterol inhaler up to every 4 hours if needed for wheezing. The other inhaler should lessen the frequency of use of albuterol. Follow-up with me in 3 months, sooner if worsening before that time.   Asthma, Adult Asthma is a recurring condition in which the airways tighten and narrow. Asthma can make it difficult to breathe. It can cause coughing, wheezing, and shortness of breath. Asthma episodes, also called asthma attacks, range from minor to life-threatening. Asthma cannot be cured, but medicines and lifestyle changes can help control it. What are the causes? Asthma is believed to be caused by inherited (genetic) and environmental factors, but its exact cause is unknown. Asthma may be triggered by allergens, lung infections, or irritants in the air. Asthma triggers are different for each person. Common triggers include:  Animal dander.  Dust mites.  Cockroaches.  Pollen from trees or grass.  Mold.  Smoke.  Air pollutants such as dust, household cleaners, hair sprays, aerosol sprays, paint fumes, strong chemicals, or strong odors.  Cold air, weather changes, and winds (which increase molds and pollens in the air).  Strong emotional expressions such as crying or laughing hard.  Stress.  Certain medicines (such as aspirin) or types of drugs (such as beta-blockers).  Sulfites in foods and drinks. Foods and drinks that may contain sulfites include dried fruit, potato chips, and sparkling grape juice.  Infections or inflammatory conditions such as the flu, a cold, or an inflammation of the nasal membranes (rhinitis).  Gastroesophageal reflux disease (GERD).  Exercise or strenuous activity. What are the signs or symptoms? Symptoms may occur immediately after asthma is triggered or many hours later. Symptoms include:  Wheezing.  Excessive nighttime or early morning coughing.  Frequent or severe coughing with a common  cold.  Chest tightness.  Shortness of breath. How is this diagnosed? The diagnosis of asthma is made by a review of your medical history and a physical exam. Tests may also be performed. These may include:  Lung function studies. These tests show how much air you breathe in and out.  Allergy tests.  Imaging tests such as X-rays. How is this treated? Asthma cannot be cured, but it can usually be controlled. Treatment involves identifying and avoiding your asthma triggers. It also involves medicines. There are 2 classes of medicine used for asthma treatment:  Controller medicines. These prevent asthma symptoms from occurring. They are usually taken every day.  Reliever or rescue medicines. These quickly relieve asthma symptoms. They are used as needed and provide short-term relief. Your health care provider will help you create an asthma action plan. An asthma action plan is a written plan for managing and treating your asthma attacks. It includes a list of your asthma triggers and how they may be avoided. It also includes information on when medicines should be taken and when their dosage should be changed. An action plan may also involve the use of a device called a peak flow meter. A peak flow meter measures how well the lungs are working. It helps you monitor your condition. Follow these instructions at home:  Take medicines only as directed by your health care provider. Speak with your health care provider if you have questions about how or when to take the medicines.  Use a peak flow meter as directed by your health care provider. Record and keep track of readings.  Understand and use the action plan to help minimize or stop an  asthma attack without needing to seek medical care.  Control your home environment in the following ways to help prevent asthma attacks:  Do not smoke. Avoid being exposed to secondhand smoke.  Change your heating and air conditioning filter  regularly.  Limit your use of fireplaces and wood stoves.  Get rid of pests (such as roaches and mice) and their droppings.  Throw away plants if you see mold on them.  Clean your floors and dust regularly. Use unscented cleaning products.  Try to have someone else vacuum for you regularly. Stay out of rooms while they are being vacuumed and for a short while afterward. If you vacuum, use a dust mask from a hardware store, a double-layered or microfilter vacuum cleaner bag, or a vacuum cleaner with a HEPA filter.  Replace carpet with wood, tile, or vinyl flooring. Carpet can trap dander and dust.  Use allergy-proof pillows, mattress covers, and box spring covers.  Wash bed sheets and blankets every week in hot water and dry them in a dryer.  Use blankets that are made of polyester or cotton.  Clean bathrooms and kitchens with bleach. If possible, have someone repaint the walls in these rooms with mold-resistant paint. Keep out of the rooms that are being cleaned and painted.  Wash hands frequently. Contact a health care provider if:  You have wheezing, shortness of breath, or a cough even if taking medicine to prevent attacks.  The colored mucus you cough up (sputum) is thicker than usual.  Your sputum changes from clear or white to yellow, green, gray, or bloody.  You have any problems that may be related to the medicines you are taking (such as a rash, itching, swelling, or trouble breathing).  You are using a reliever medicine more than 2-3 times per week.  Your peak flow is still at 50-79% of your personal best after following your action plan for 1 hour.  You have a fever. Get help right away if:  You seem to be getting worse and are unresponsive to treatment during an asthma attack.  You are short of breath even at rest.  You get short of breath when doing very little physical activity.  You have difficulty eating, drinking, or talking due to asthma  symptoms.  You develop chest pain.  You develop a fast heartbeat.  You have a bluish color to your lips or fingernails.  You are light-headed, dizzy, or faint.  Your peak flow is less than 50% of your personal best. This information is not intended to replace advice given to you by your health care provider. Make sure you discuss any questions you have with your health care provider. Document Released: 08/14/2005 Document Revised: 01/26/2016 Document Reviewed: 03/13/2013 Elsevier Interactive Patient Education  2017 [employer]   IF you received an x-ray today, you will receive an invoice from [facility]. Please contact [facility] at [phone] with questions or concerns regarding your invoice.   IF you received labwork today, you will receive an invoice from [facility]. Please contact [facility] at [phone] with questions or concerns regarding your invoice.   Our billing staff will not be able to assist you with questions regarding bills from these companies.  You will be contacted with the lab results as soon as they are available. The fastest way to get your results is to activate your My Chart account. Instructions are located on the last page of this paperwork. If you have not heard from us regarding  the results in 2 weeks, please contact this office.

## 2017-02-18 LAB — GLUCOSE, POCT (MANUAL RESULT ENTRY): POC Glucose: 116 mg/dl — AB (ref 70–99)

## 2018-01-10 ENCOUNTER — Other Ambulatory Visit: Payer: Self-pay | Admitting: Family Medicine

## 2018-01-10 DIAGNOSIS — J45909 Unspecified asthma, uncomplicated: Secondary | ICD-10-CM

## 2019-06-16 ENCOUNTER — Encounter: Payer: Self-pay | Admitting: Family Medicine

## 2019-06-16 ENCOUNTER — Ambulatory Visit (INDEPENDENT_AMBULATORY_CARE_PROVIDER_SITE_OTHER): Payer: Self-pay | Admitting: Family Medicine

## 2019-06-16 ENCOUNTER — Other Ambulatory Visit: Payer: Self-pay

## 2019-06-16 VITALS — BP 124/74 | HR 68 | Temp 98.5°F | Wt 192.0 lb

## 2019-06-16 DIAGNOSIS — M67472 Ganglion, left ankle and foot: Secondary | ICD-10-CM

## 2019-06-16 DIAGNOSIS — R1032 Left lower quadrant pain: Secondary | ICD-10-CM

## 2019-06-16 DIAGNOSIS — R351 Nocturia: Secondary | ICD-10-CM

## 2019-06-16 DIAGNOSIS — N451 Epididymitis: Secondary | ICD-10-CM

## 2019-06-16 LAB — POCT URINALYSIS DIP (MANUAL ENTRY)
Bilirubin, UA: NEGATIVE
Blood, UA: NEGATIVE
Glucose, UA: NEGATIVE mg/dL
Ketones, POC UA: NEGATIVE mg/dL
Leukocytes, UA: NEGATIVE
Nitrite, UA: NEGATIVE
Protein Ur, POC: NEGATIVE mg/dL
Spec Grav, UA: 1.025 (ref 1.010–1.025)
Urobilinogen, UA: 0.2 E.U./dL
pH, UA: 6.5 (ref 5.0–8.0)

## 2019-06-16 LAB — POC MICROSCOPIC URINALYSIS (UMFC): Mucus: ABSENT

## 2019-06-16 MED ORDER — CIPROFLOXACIN HCL 500 MG PO TABS: 500.0000 mg | ORAL_TABLET | Freq: Two times a day (BID) | ORAL | 0 refills | Status: DC

## 2019-06-16 NOTE — Progress Notes (Signed)
Subjective:    Patient ID: Isaac Estrada, male    DOB: [birth year], 38 y.o.   MRN: [id number]  HPI Isaac Estrada is a 38 y.o. male Presents today for: Chief Complaint  Patient presents with  . Cyst    cyst on left foot for 1 month  . Abdominal Pain    left side abd pressure thats been going on for a month    L foot cyst: Noted past 1 month.  NKI. No pain. Just present. Same footwear. Home workouts, no new exercises. No bruising.   Abdominal pain: Left sided, pressure feeling. Past 1 month. No swollen area. No hx of hernia. ometimes more urinary frequency at night past few weeks, 1-2 episodes per night. Normal urination during the day. No dysuria. No hematuria. No penile d/c. Slight discomfort behind L testicle 2 weeks ago, now improved.  Sexually active - same partner for 4 years. No other partners. No recent hxo f STI (12-13 yrs ago).  Last WU:JWJX morning. No constipation, no diarrhea. No pain with BM's. No dark stools or blood in stools. No fever, n/v.  No hx of diverticulosis/diverticulitis.    Patient Active Problem List   Diagnosis Date Noted  . Asthma 12/30/2012  . Perennial allergic rhinitis with seasonal variation 12/30/2012   Past Medical History:  Diagnosis Date  . Allergy   . Asthma    No past surgical history on file. No Known Allergies Prior to Admission medications   Medication Sig Start Date End Date Taking? Authorizing Provider  Albuterol Sulfate (PROAIR RESPICLICK) 914 (90 Base) MCG/ACT AEPB Inhale 1 Inhaler into the lungs every 4 (four) hours as needed. 07/24/16   Wendie Agreste, MD  budesonide-formoterol [facility]) 80-4.5 MCG/ACT inhaler Inhale 2 puffs into the lungs 2 (two) times daily. 07/24/16   Wendie Agreste, MD   Social History   Socioeconomic History  . Marital status: Single    Spouse name: n/a  . Number of children: 5  . Years of education: 66  . Highest education level: Not on file  Occupational History  . Occupation: unemployed   Social Needs  . Financial resource strain: Not on file  . Food insecurity    Worry: Not on file    Inability: Not on file  . Transportation needs    Medical: Not on file    Non-medical: Not on file  Tobacco Use  . Smoking status: Never Smoker  . Smokeless tobacco: Never Used  Substance and Sexual Activity  . Alcohol use: No  . Drug use: No  . Sexual activity: Yes  Lifestyle  . Physical activity    Days per week: Not on file    Minutes per session: Not on file  . Stress: Not on file  Relationships  . Social Herbalist on phone: Not on file    Gets together: Not on file    Attends religious service: Not on file    Active member of club or organization: Not on file    Attends meetings of clubs or organizations: Not on file    Relationship status: Not on file  . Intimate partner violence    Fear of current or ex partner: Not on file    Emotionally abused: Not on file    Physically abused: Not on file    Forced sexual activity: Not on file  Other Topics Concern  . Not on file  Social History Narrative   Lives with his mother, step-father.  He shares custody of his 5 children.    Review of Systems Per HPI    Objective:   Physical Exam Constitutional:      General: He is not in acute distress.    Appearance: He is well-developed.  HENT:     Head: Normocephalic and atraumatic.  Cardiovascular:     Rate and Rhythm: Normal rate.  Pulmonary:     Effort: Pulmonary effort is normal.  Abdominal:     Tenderness: There is abdominal tenderness (Minimal discomfort but not pain left lower quadrant/suprapubic) in the left lower quadrant. There is no right CVA tenderness, left CVA tenderness, guarding or rebound. Negative signs include Murphy's sign and McBurney's sign.     Hernia: No hernia is present.  Genitourinary:    Scrotum/Testes:        Left: Tenderness (Left epididymal tenderness without apparent swelling.  No apparent nodules/masses.) present.  Skin:       Neurological:     Mental Status: He is alert and oriented to person, place, and time.    Vitals:   06/16/19 1652  BP: 124/74  Pulse: 68  Temp: 98.5 F (36.9 C)  TempSrc: Oral  SpO2: 99%  Weight: 192 lb (87.1 kg)     Results for orders placed or performed in visit on 06/16/19  CBC  Result Value Ref Range   WBC 4.6 3.4 - 10.8 x10E3/uL   RBC 5.22 4.14 - 5.80 x10E6/uL   Hemoglobin 14.3 13.0 - 17.7 g/dL   Hematocrit 40.9 81.1 - 51.0 %   MCV 83 79 - 97 fL   MCH 27.4 26.6 - 33.0 pg   MCHC 32.9 31.5 - 35.7 g/dL   RDW 91.4 78.2 - 95.6 %   Platelets 269 150 - 450 x10E3/uL  PSA  Result Value Ref Range   Prostate Specific Ag, Serum WILL FOLLOW   POCT urinalysis dipstick  Result Value Ref Range   Color, UA yellow yellow   Clarity, UA clear clear   Glucose, UA negative negative mg/dL   Bilirubin, UA negative negative   Ketones, POC UA negative negative mg/dL   Spec Grav, UA 2.130 8.657 - 1.025   Blood, UA negative negative   pH, UA 6.5 5.0 - 8.0   Protein Ur, POC negative negative mg/dL   Urobilinogen, UA 0.2 0.2 or 1.0 E.U./dL   Nitrite, UA Negative Negative   Leukocytes, UA Negative Negative  POCT Microscopic Urinalysis ([facility])  Result Value Ref Range   WBC,UR,HPF,POC None None WBC/hpf   RBC,UR,HPF,POC None None RBC/hpf   Bacteria None None, Too numerous to count   Mucus Absent Absent   Epithelial Cells, UR Per Microscopy Few (A) None, Too numerous to count cells/hpf       Assessment & Plan:    Isaac Estrada is a 38 y.o. male LLQ abdominal pain - Plan: CBC Epididymitis, left - Plan: ciprofloxacin (CIPRO) 500 MG tablet Nocturia - Plan: POCT urinalysis dipstick, POCT Microscopic Urinalysis ([facility]), PSA  -Possible epididymitis, overall reassuring exam.  Low risk for STIs.  Testing deferred at present  -Start Cipro 500 mg twice daily, 10 days, check PSA, RTC precautions including if any worsening abdominal pain  Ganglion cyst of left foot  -Suspected ganglion cyst, but  minimal symptoms.  Also at this location near dorsalis pedis would be hesitant to excise/aspirate in office.  Recommended podiatry follow-up/foot specialist follow-up if persistent symptoms next 4 to 6 weeks.  RTC precautions if worsening.     Meds ordered this  encounter  Medications  . ciprofloxacin (CIPRO) 500 MG tablet    Sig: Take 1 tablet (500 mg total) by mouth 2 (two) times daily.    Dispense:  20 tablet    Refill:  0   Patient Instructions     If cyst on foot not improving in next 4-6 weeks, let me know and I can refer you to foot specialist.  Sooner if worse.   Start Cipro for possible epididymitis.  I am checking a prostate test as well.  If abdominal pain is not improving in the next 1 week or any worsening during that time, follow-up for other testing.  Return to the clinic or go to the nearest emergency room if any of your symptoms worsen or new symptoms occur.   Abdominal Pain, Adult Abdominal pain can be caused by many things. Often, abdominal pain is not serious and it gets better with no treatment or by being treated at home. However, sometimes abdominal pain is serious. Your health care provider will do a medical history and a physical exam to try to determine the cause of your abdominal pain. Follow these instructions at home:  Take over-the-counter and prescription medicines only as told by your health care provider. Do not take a laxative unless told by your health care provider.  Drink enough fluid to keep your urine clear or pale yellow.  Watch your condition for any changes.  Keep all follow-up visits as told by your health care provider. This is important. Contact a health care provider if:  Your abdominal pain changes or gets worse.  You are not hungry or you lose weight without trying.  You are constipated or have diarrhea for more than 2-3 days.  You have pain when you urinate or have a bowel movement.  Your abdominal pain wakes you up at night.   Your pain gets worse with meals, after eating, or with certain foods.  You are throwing up and cannot keep anything down.  You have a fever. Get help right away if:  Your pain does not go away as soon as your health care provider told you to expect.  You cannot stop throwing up.  Your pain is only in areas of the abdomen, such as the right side or the left lower portion of the abdomen.  You have bloody or black stools, or stools that look like tar.  You have severe pain, cramping, or bloating in your abdomen.  You have signs of dehydration, such as: ? Dark urine, very little urine, or no urine. ? Cracked lips. ? Dry mouth. ? Sunken eyes. ? Sleepiness. ? Weakness. This information is not intended to replace advice given to you by your health care provider. Make sure you discuss any questions you have with your health care provider. Document Released: 05/24/2005 Document Revised: 03/03/2016 Document Reviewed: 01/26/2016 Elsevier Interactive Patient Education  2020 [employer].    Epididymitis  Epididymitis is swelling (inflammation) or infection of the epididymis. The epididymis is a cord-like structure that is located along the top and back part of the testicle. It collects and stores sperm from the testicle. This condition can also cause pain and swelling of the testicle and scrotum. Symptoms usually start suddenly (acute epididymitis). Sometimes epididymitis starts gradually and lasts for a while (chronic epididymitis). This type may be harder to treat. What are the causes? In men ages 49-40, this condition is usually caused by a bacterial infection or a sexually transmitted disease (STD), such as:  Gonorrhea.  Chlamydia. In men 8740 and older who do not have anal sex, this condition is usually caused by bacteria from a blockage or from abnormalities in the urinary system. These can result from:  Having a tube placed into the bladder (urinary catheter).  Having an  enlarged or inflamed prostate gland.  Having recently had urinary tract surgery.  Having a problem with a backward flow of urine (retrograde). In men who have a condition that weakens the body's defense system (immune system), such as HIV, this condition can be caused by:  Other bacteria, including tuberculosis and syphilis.  Viruses.  Fungi. Sometimes this condition occurs without infection. This may happen because of trauma or repetitive activities such as sports. What increases the risk? You are more likely to develop this condition if you have:  Unprotected sex with more than one partner.  Anal sex.  Recently had surgery.  A urinary catheter.  Urinary problems.  A suppressed immune system. What are the signs or symptoms? This condition usually begins suddenly with chills, fever, and pain behind the scrotum and in the testicle. Other symptoms include:  Swelling of the scrotum, testicle, or both.  Pain when ejaculating or urinating.  Pain in the back or abdomen.  Nausea.  Itching and discharge from the penis.  A frequent need to pass urine.  Redness, increased warmth, and tenderness of the scrotum. How is this diagnosed? Your health care provider can diagnose this condition based on your symptoms and medical history. Your health care provider will also do a physical exam to ask about your symptoms and check your scrotum and testicle for swelling, pain, and redness. You may also have other tests, including:  Examination of discharge from the penis.  Urine tests for infections, such as STDs.  Ultrasound test for blood flow and inflammation. Your health care provider may test you for other STDs, including HIV. How is this treated? Treatment for this condition depends on the cause. If your condition is caused by a bacterial infection, oral antibiotic medicine may be prescribed. If the bacterial infection has spread to your blood, you may need to receive IV  antibiotics. For both bacterial and nonbacterial epididymitis, you may be treated with:  Rest.  Elevation of the scrotum.  Pain medicines.  Anti-inflammatory medicines. Surgery may be needed to treat:  Bacterial epididymitis that causes pus to build up in the scrotum (abscess).  Chronic epididymitis that has not responded to other treatments. Follow these instructions at home: Medicines  Take over-the-counter and prescription medicines only as told by your health care provider.  If you were prescribed an antibiotic medicine, take it as told by your health care provider. Do not stop taking the antibiotic even if your condition improves. Sexual activity  If your epididymitis was caused by an STD, avoid sexual activity until your treatment is complete.  Inform your sexual partner or partners if you test positive for an STD. They may need to be treated. Do not engage in sexual activity with your partner or partners until their treatment is completed. Managing pain and swelling   If directed, elevate your scrotum and apply ice. ? Put ice in a plastic bag. ? Place a small towel or pillow between your legs. ? Rest your scrotum on the pillow or towel. ? Place another towel between your skin and the plastic bag. ? Leave the ice on for 20 minutes, 2-3 times a day.  Try taking a sitz bath to help with discomfort. This is  a warm water bath that is taken while you are sitting down. The water should only come up to your hips and should cover your buttocks. Do this 3-4 times per day or as told by your health care provider.  Keep your scrotum elevated and supported while resting. Ask your health care provider if you should wear a scrotal support, such as a jockstrap. Wear it as told by your health care provider. General instructions  Return to your normal activities as told by your health care provider. Ask your health care provider what activities are safe for you.  Drink enough fluid to  keep your urine pale yellow.  Keep all follow-up visits as told by your health care provider. This is important. Contact a health care provider if:  You have a fever.  Your pain medicine is not helping.  Your pain is getting worse.  Your symptoms do not improve within 3 days. Summary  Epididymitis is swelling (inflammation) or infection of the epididymis. This condition can also cause pain and swelling of the testicle and scrotum.  Treatment for this condition depends on the cause. If your condition is caused by a bacterial infection, oral antibiotic medicine may be prescribed.  Inform your sexual partner or partners if you test positive for an STD. They may need to be treated. Do not engage in sexual activity with your partner or partners until their treatment is completed.  Contact a health care provider if your symptoms do not improve within 3 days. This information is not intended to replace advice given to you by your health care provider. Make sure you discuss any questions you have with your health care provider. Document Released: 08/11/2000 Document Revised: 06/17/2018 Document Reviewed: 06/18/2018 Elsevier Patient Education  [employer].    If you have lab work done today you will be contacted with your lab results within the next 2 weeks.  If you have not heard from [place] then please contact us. The fastest way to get your results is to register for My Chart.   IF you received an x-ray today, you will receive an invoice from [facility]. Please contact [facility] at [phone] with questions or concerns regarding your invoice.   IF you received labwork today, you will receive an invoice from [place]. Please contact [facility] at [phone] with questions or concerns regarding your invoice.   Our billing staff will not be able to assist you with questions regarding bills from these companies.  You will be contacted with the lab results as soon  as they are available. The fastest way to get your results is to activate your My Chart account. Instructions are located on the last page of this paperwork. If you have not heard from [place] regarding the results in 2 weeks, please contact this office.       Signed,   Meredith Staggers, MD Primary Care at [facility].  06/17/19 10:29 AM

## 2019-06-16 NOTE — Patient Instructions (Addendum)
If cyst on foot not improving in next 4-6 weeks, let me know and I can refer you to foot specialist.  Sooner if worse.   Start Cipro for possible epididymitis.  I am checking a prostate test as well.  If abdominal pain is not improving in the next 1 week or any worsening during that time, follow-up for other testing.  Return to the clinic or go to the nearest emergency room if any of your symptoms worsen or new symptoms occur.   Abdominal Pain, Adult Abdominal pain can be caused by many things. Often, abdominal pain is not serious and it gets better with no treatment or by being treated at home. However, sometimes abdominal pain is serious. Your health care provider will do a medical history and a physical exam to try to determine the cause of your abdominal pain. Follow these instructions at home:  Take over-the-counter and prescription medicines only as told by your health care provider. Do not take a laxative unless told by your health care provider.  Drink enough fluid to keep your urine clear or pale yellow.  Watch your condition for any changes.  Keep all follow-up visits as told by your health care provider. This is important. Contact a health care provider if:  Your abdominal pain changes or gets worse.  You are not hungry or you lose weight without trying.  You are constipated or have diarrhea for more than 2-3 days.  You have pain when you urinate or have a bowel movement.  Your abdominal pain wakes you up at night.  Your pain gets worse with meals, after eating, or with certain foods.  You are throwing up and cannot keep anything down.  You have a fever. Get help right away if:  Your pain does not go away as soon as your health care provider told you to expect.  You cannot stop throwing up.  Your pain is only in areas of the abdomen, such as the right side or the left lower portion of the abdomen.  You have bloody or black stools, or stools that look like  tar.  You have severe pain, cramping, or bloating in your abdomen.  You have signs of dehydration, such as: ? Dark urine, very little urine, or no urine. ? Cracked lips. ? Dry mouth. ? Sunken eyes. ? Sleepiness. ? Weakness. This information is not intended to replace advice given to you by your health care provider. Make sure you discuss any questions you have with your health care provider. Document Released: 05/24/2005 Document Revised: 03/03/2016 Document Reviewed: 01/26/2016 Elsevier Interactive Patient Education  [place].    Epididymitis  Epididymitis is swelling (inflammation) or infection of the epididymis. The epididymis is a cord-like structure that is located along the top and back part of the testicle. It collects and stores sperm from the testicle. This condition can also cause pain and swelling of the testicle and scrotum. Symptoms usually start suddenly (acute epididymitis). Sometimes epididymitis starts gradually and lasts for a while (chronic epididymitis). This type may be harder to treat. What are the causes? In men ages 39-40, this condition is usually caused by a bacterial infection or a sexually transmitted disease (STD), such as:  Gonorrhea.  Chlamydia. In men 28 and older who do not have anal sex, this condition is usually caused by bacteria from a blockage or from abnormalities in the urinary system. These can result from:  Having a tube placed into the bladder (urinary catheter).  Having an enlarged or inflamed prostate gland.  Having recently had urinary tract surgery.  Having a problem with a backward flow of urine (retrograde). In men who have a condition that weakens the body's defense system (immune system), such as HIV, this condition can be caused by:  Other bacteria, including tuberculosis and syphilis.  Viruses.  Fungi. Sometimes this condition occurs without infection. This may happen because of trauma or repetitive activities  such as sports. What increases the risk? You are more likely to develop this condition if you have:  Unprotected sex with more than one partner.  Anal sex.  Recently had surgery.  A urinary catheter.  Urinary problems.  A suppressed immune system. What are the signs or symptoms? This condition usually begins suddenly with chills, fever, and pain behind the scrotum and in the testicle. Other symptoms include:  Swelling of the scrotum, testicle, or both.  Pain when ejaculating or urinating.  Pain in the back or abdomen.  Nausea.  Itching and discharge from the penis.  A frequent need to pass urine.  Redness, increased warmth, and tenderness of the scrotum. How is this diagnosed? Your health care provider can diagnose this condition based on your symptoms and medical history. Your health care provider will also do a physical exam to ask about your symptoms and check your scrotum and testicle for swelling, pain, and redness. You may also have other tests, including:  Examination of discharge from the penis.  Urine tests for infections, such as STDs.  Ultrasound test for blood flow and inflammation. Your health care provider may test you for other STDs, including HIV. How is this treated? Treatment for this condition depends on the cause. If your condition is caused by a bacterial infection, oral antibiotic medicine may be prescribed. If the bacterial infection has spread to your blood, you may need to receive IV antibiotics. For both bacterial and nonbacterial epididymitis, you may be treated with:  Rest.  Elevation of the scrotum.  Pain medicines.  Anti-inflammatory medicines. Surgery may be needed to treat:  Bacterial epididymitis that causes pus to build up in the scrotum (abscess).  Chronic epididymitis that has not responded to other treatments. Follow these instructions at home: Medicines  Take over-the-counter and prescription medicines only as told by  your health care provider.  If you were prescribed an antibiotic medicine, take it as told by your health care provider. Do not stop taking the antibiotic even if your condition improves. Sexual activity  If your epididymitis was caused by an STD, avoid sexual activity until your treatment is complete.  Inform your sexual partner or partners if you test positive for an STD. They may need to be treated. Do not engage in sexual activity with your partner or partners until their treatment is completed. Managing pain and swelling   If directed, elevate your scrotum and apply ice. ? Put ice in a plastic bag. ? Place a small towel or pillow between your legs. ? Rest your scrotum on the pillow or towel. ? Place another towel between your skin and the plastic bag. ? Leave the ice on for 20 minutes, 2-3 times a day.  Try taking a sitz bath to help with discomfort. This is a warm water bath that is taken while you are sitting down. The water should only come up to your hips and should cover your buttocks. Do this 3-4 times per day or as told by your health care provider.  Keep your scrotum elevated and  supported while resting. Ask your health care provider if you should wear a scrotal support, such as a jockstrap. Wear it as told by your health care provider. General instructions  Return to your normal activities as told by your health care provider. Ask your health care provider what activities are safe for you.  Drink enough fluid to keep your urine pale yellow.  Keep all follow-up visits as told by your health care provider. This is important. Contact a health care provider if:  You have a fever.  Your pain medicine is not helping.  Your pain is getting worse.  Your symptoms do not improve within 3 days. Summary  Epididymitis is swelling (inflammation) or infection of the epididymis. This condition can also cause pain and swelling of the testicle and scrotum.  Treatment for this  condition depends on the cause. If your condition is caused by a bacterial infection, oral antibiotic medicine may be prescribed.  Inform your sexual partner or partners if you test positive for an STD. They may need to be treated. Do not engage in sexual activity with your partner or partners until their treatment is completed.  Contact a health care provider if your symptoms do not improve within 3 days. This information is not intended to replace advice given to you by your health care provider. Make sure you discuss any questions you have with your health care provider. Document Released: 08/11/2000 Document Revised: 06/17/2018 Document Reviewed: 06/18/2018 Elsevier Patient Education  [employer]2020 [employer]    If you have lab work done today you will be contacted with your lab results within the next 2 weeks.  If you have not heard from us then please contact us. The fastest way to get your results is to register for My Chart.   IF you received an x-ray today, you will receive an invoice from [facility]. Please contact [facility] at [phone] with questions or concerns regarding your invoice.   IF you received labwork today, you will receive an invoice from [facility]. Please contact [facility] at [phone] with questions or concerns regarding your invoice.   Our billing staff will not be able to assist you with questions regarding bills from these companies.  You will be contacted with the lab results as soon as they are available. The fastest way to get your results is to activate your My Chart account. Instructions are located on the last page of this paperwork. If you have not heard from us regarding the results in 2 weeks, please contact this office.

## 2019-06-17 ENCOUNTER — Encounter: Payer: Self-pay | Admitting: Family Medicine

## 2019-06-17 LAB — PSA: Prostate Specific Ag, Serum: 1 ng/mL (ref 0.0–4.0)

## 2019-06-17 LAB — CBC
Hematocrit: 43.5 % (ref 37.5–51.0)
Hemoglobin: 14.3 g/dL (ref 13.0–17.7)
MCH: 27.4 pg (ref 26.6–33.0)
MCHC: 32.9 g/dL (ref 31.5–35.7)
MCV: 83 fL (ref 79–97)
Platelets: 269 10*3/uL (ref 150–450)
RBC: 5.22 x10E6/uL (ref 4.14–5.80)
RDW: 12.8 % (ref 11.6–15.4)
WBC: 4.6 10*3/uL (ref 3.4–10.8)

## 2020-01-07 ENCOUNTER — Inpatient Hospital Stay (HOSPITAL_COMMUNITY): Payer: BLUE CROSS/BLUE SHIELD

## 2020-01-07 ENCOUNTER — Inpatient Hospital Stay: Payer: Self-pay

## 2020-01-07 ENCOUNTER — Other Ambulatory Visit: Payer: Self-pay

## 2020-01-07 ENCOUNTER — Inpatient Hospital Stay (HOSPITAL_COMMUNITY)
Admission: EM | Admit: 2020-01-07 | Discharge: 2020-01-13 | DRG: 208 | Disposition: A | Discharge status: Home / Self Care | Payer: BLUE CROSS/BLUE SHIELD | Attending: Internal Medicine | Admitting: Internal Medicine

## 2020-01-07 ENCOUNTER — Emergency Department (HOSPITAL_COMMUNITY): Payer: BLUE CROSS/BLUE SHIELD

## 2020-01-07 DIAGNOSIS — Z833 Family history of diabetes mellitus: Secondary | ICD-10-CM

## 2020-01-07 DIAGNOSIS — E872 Acidosis: Secondary | ICD-10-CM | POA: Diagnosis present

## 2020-01-07 DIAGNOSIS — J9622 Acute and chronic respiratory failure with hypercapnia: Secondary | ICD-10-CM

## 2020-01-07 DIAGNOSIS — Z20822 Contact with and (suspected) exposure to covid-19: Secondary | ICD-10-CM | POA: Diagnosis present

## 2020-01-07 DIAGNOSIS — J9621 Acute and chronic respiratory failure with hypoxia: Secondary | ICD-10-CM

## 2020-01-07 DIAGNOSIS — Z789 Other specified health status: Secondary | ICD-10-CM

## 2020-01-07 DIAGNOSIS — J9601 Acute respiratory failure with hypoxia: Secondary | ICD-10-CM | POA: Diagnosis not present

## 2020-01-07 DIAGNOSIS — Z7951 Long term (current) use of inhaled steroids: Secondary | ICD-10-CM | POA: Diagnosis not present

## 2020-01-07 DIAGNOSIS — R410 Disorientation, unspecified: Secondary | ICD-10-CM | POA: Diagnosis not present

## 2020-01-07 DIAGNOSIS — Z825 Family history of asthma and other chronic lower respiratory diseases: Secondary | ICD-10-CM

## 2020-01-07 DIAGNOSIS — R339 Retention of urine, unspecified: Secondary | ICD-10-CM | POA: Diagnosis not present

## 2020-01-07 DIAGNOSIS — E875 Hyperkalemia: Secondary | ICD-10-CM | POA: Diagnosis not present

## 2020-01-07 DIAGNOSIS — J9602 Acute respiratory failure with hypercapnia: Secondary | ICD-10-CM | POA: Diagnosis present

## 2020-01-07 DIAGNOSIS — J4552 Severe persistent asthma with status asthmaticus: Secondary | ICD-10-CM | POA: Diagnosis present

## 2020-01-07 DIAGNOSIS — J9600 Acute respiratory failure, unspecified whether with hypoxia or hypercapnia: Secondary | ICD-10-CM | POA: Diagnosis present

## 2020-01-07 DIAGNOSIS — X500XXA Overexertion from strenuous movement or load, initial encounter: Secondary | ICD-10-CM

## 2020-01-07 DIAGNOSIS — R451 Restlessness and agitation: Secondary | ICD-10-CM | POA: Diagnosis not present

## 2020-01-07 DIAGNOSIS — S0300XD Dislocation of jaw, unspecified side, subsequent encounter: Secondary | ICD-10-CM | POA: Diagnosis not present

## 2020-01-07 DIAGNOSIS — J4551 Severe persistent asthma with (acute) exacerbation: Secondary | ICD-10-CM | POA: Insufficient documentation

## 2020-01-07 DIAGNOSIS — R4182 Altered mental status, unspecified: Secondary | ICD-10-CM

## 2020-01-07 DIAGNOSIS — S0303XA Dislocation of jaw, bilateral, initial encounter: Secondary | ICD-10-CM | POA: Diagnosis not present

## 2020-01-07 DIAGNOSIS — J45909 Unspecified asthma, uncomplicated: Secondary | ICD-10-CM

## 2020-01-07 DIAGNOSIS — Z452 Encounter for adjustment and management of vascular access device: Secondary | ICD-10-CM

## 2020-01-07 DIAGNOSIS — J45901 Unspecified asthma with (acute) exacerbation: Secondary | ICD-10-CM | POA: Diagnosis not present

## 2020-01-07 DIAGNOSIS — Z4659 Encounter for fitting and adjustment of other gastrointestinal appliance and device: Secondary | ICD-10-CM

## 2020-01-07 DIAGNOSIS — J45902 Unspecified asthma with status asthmaticus: Secondary | ICD-10-CM | POA: Diagnosis not present

## 2020-01-07 LAB — POCT I-STAT 7, (LYTES, BLD GAS, ICA,H+H)
Acid-base deficit: 1 mmol/L (ref 0.0–2.0)
Acid-base deficit: 3 mmol/L — ABNORMAL HIGH (ref 0.0–2.0)
Acid-base deficit: 8 mmol/L — ABNORMAL HIGH (ref 0.0–2.0)
Bicarbonate: 30.9 mmol/L — ABNORMAL HIGH (ref 20.0–28.0)
Bicarbonate: 28.9 mmol/L — ABNORMAL HIGH (ref 20.0–28.0)
Bicarbonate: 21.8 mmol/L (ref 20.0–28.0)
Calcium, Ion: 1.21 mmol/L (ref 1.15–1.40)
Calcium, Ion: 1.26 mmol/L (ref 1.15–1.40)
Calcium, Ion: 1.12 mmol/L — ABNORMAL LOW (ref 1.15–1.40)
HCT: 41 % (ref 39.0–52.0)
HCT: 39 % (ref 39.0–52.0)
HCT: 32 % — ABNORMAL LOW (ref 39.0–52.0)
Hemoglobin: 13.9 g/dL (ref 13.0–17.0)
Hemoglobin: 13.3 g/dL (ref 13.0–17.0)
Hemoglobin: 10.9 g/dL — ABNORMAL LOW (ref 13.0–17.0)
O2 Saturation: 100 %
O2 Saturation: 99 %
O2 Saturation: 97 %
Patient temperature: 94.6
Patient temperature: 35.4
Patient temperature: 37.4
Potassium: 4.4 mmol/L (ref 3.5–5.1)
Potassium: 4.3 mmol/L (ref 3.5–5.1)
Potassium: 4.2 mmol/L (ref 3.5–5.1)
Sodium: 139 mmol/L (ref 135–145)
Sodium: 140 mmol/L (ref 135–145)
Sodium: 142 mmol/L (ref 135–145)
TCO2: 34 mmol/L — ABNORMAL HIGH (ref 22–32)
TCO2: 32 mmol/L (ref 22–32)
TCO2: 24 mmol/L (ref 22–32)
pCO2 arterial: 82.8 mmHg (ref 32.0–48.0)
pCO2 arterial: 82.3 mmHg (ref 32.0–48.0)
pCO2 arterial: 65 mmHg — ABNORMAL HIGH (ref 32.0–48.0)
pH, Arterial: 7.167 — CL (ref 7.350–7.450)
pH, Arterial: 7.144 — CL (ref 7.350–7.450)
pH, Arterial: 7.136 — CL (ref 7.350–7.450)
pO2, Arterial: 425 mmHg — ABNORMAL HIGH (ref 83.0–108.0)
pO2, Arterial: 201 mmHg — ABNORMAL HIGH (ref 83.0–108.0)
pO2, Arterial: 127 mmHg — ABNORMAL HIGH (ref 83.0–108.0)

## 2020-01-07 LAB — URINALYSIS, ROUTINE W REFLEX MICROSCOPIC
Bilirubin Urine: NEGATIVE
Glucose, UA: 150 mg/dL — AB
Hgb urine dipstick: NEGATIVE
Ketones, ur: NEGATIVE mg/dL
Leukocytes,Ua: NEGATIVE
Nitrite: NEGATIVE
Protein, ur: 30 mg/dL — AB
Specific Gravity, Urine: 1.016 (ref 1.005–1.030)
pH: 5 (ref 5.0–8.0)

## 2020-01-07 LAB — COMPREHENSIVE METABOLIC PANEL
ALT: 17 U/L (ref 0–44)
AST: 24 U/L (ref 15–41)
Albumin: 3.9 g/dL (ref 3.5–5.0)
Alkaline Phosphatase: 42 U/L (ref 38–126)
Anion gap: 6 (ref 5–15)
BUN: 17 mg/dL (ref 6–20)
CO2: 28 mmol/L (ref 22–32)
Calcium: 8.6 mg/dL — ABNORMAL LOW (ref 8.9–10.3)
Chloride: 105 mmol/L (ref 98–111)
Creatinine, Ser: 1.27 mg/dL — ABNORMAL HIGH (ref 0.61–1.24)
GFR calc Af Amer: >60 mL/min (ref >60)
GFR calc non Af Amer: >60 mL/min (ref >60)
Glucose, Bld: 212 mg/dL — ABNORMAL HIGH (ref 70–99)
Potassium: 4.7 mmol/L (ref 3.5–5.1)
Sodium: 139 mmol/L (ref 135–145)
Total Bilirubin: 0.9 mg/dL (ref 0.3–1.2)
Total Protein: 6.9 g/dL (ref 6.5–8.1)

## 2020-01-07 LAB — CBC WITH DIFFERENTIAL/PLATELET
Abs Immature Granulocytes: 0.07 10*3/uL (ref 0.00–0.07)
Basophils Absolute: 0 10*3/uL (ref 0.0–0.1)
Basophils Relative: 1 %
Eosinophils Absolute: 0.3 10*3/uL (ref 0.0–0.5)
Eosinophils Relative: 5 %
HCT: 42.1 % (ref 39.0–52.0)
Hemoglobin: 13.3 g/dL (ref 13.0–17.0)
Immature Granulocytes: 1 %
Lymphocytes Relative: 55 %
Lymphs Abs: 3.4 10*3/uL (ref 0.7–4.0)
MCH: 27.9 pg (ref 26.0–34.0)
MCHC: 31.6 g/dL (ref 30.0–36.0)
MCV: 88.3 fL (ref 80.0–100.0)
Monocytes Absolute: 0.3 10*3/uL (ref 0.1–1.0)
Monocytes Relative: 6 %
Neutro Abs: 2 10*3/uL (ref 1.7–7.7)
Neutrophils Relative %: 32 %
Platelets: 309 10*3/uL (ref 150–400)
RBC: 4.77 MIL/uL (ref 4.22–5.81)
RDW: 12.8 % (ref 11.5–15.5)
WBC: 6.2 10*3/uL (ref 4.0–10.5)
nRBC: 0 % (ref 0.0–0.2)

## 2020-01-07 LAB — SURGICAL PCR SCREEN
MRSA, PCR: NEGATIVE
Staphylococcus aureus: POSITIVE — AB

## 2020-01-07 LAB — ABO/RH: ABO/RH(D): A POS

## 2020-01-07 LAB — RESPIRATORY PANEL BY PCR

## 2020-01-07 LAB — POCT I-STAT EG7
Acid-base deficit: 5 mmol/L — ABNORMAL HIGH (ref 0.0–2.0)
Bicarbonate: 24.5 mmol/L (ref 20.0–28.0)
Calcium, Ion: 1.24 mmol/L (ref 1.15–1.40)
HCT: 40 % (ref 39.0–52.0)
Hemoglobin: 13.6 g/dL (ref 13.0–17.0)
O2 Saturation: 99 %
Potassium: 3.6 mmol/L (ref 3.5–5.1)
Sodium: 139 mmol/L (ref 135–145)
TCO2: 27 mmol/L (ref 22–32)
pCO2, Ven: 67.7 mmHg — ABNORMAL HIGH (ref 44.0–60.0)
pH, Ven: 7.168 — CL (ref 7.250–7.430)
pO2, Ven: 206 mmHg — ABNORMAL HIGH (ref 32.0–45.0)

## 2020-01-07 LAB — BLOOD GAS, ARTERIAL
Acid-base deficit: 1.6 mmol/L (ref 0.0–2.0)
Acid-base deficit: 1.9 mmol/L (ref 0.0–2.0)
Bicarbonate: 28.8 mmol/L — ABNORMAL HIGH (ref 20.0–28.0)
Bicarbonate: 27.2 mmol/L (ref 20.0–28.0)
Drawn by: 448981
Drawn by: 448981
FIO2: 100
FIO2: 40
O2 Saturation: 99 %
O2 Saturation: 86.6 %
Patient temperature: 36.3
Patient temperature: 35
pCO2 arterial: 114 mmHg (ref 32.0–48.0)
pCO2 arterial: 86 mmHg (ref 32.0–48.0)
pH, Arterial: 7.024 — CL (ref 7.350–7.450)
pH, Arterial: 7.111 — CL (ref 7.350–7.450)
pO2, Arterial: 356 mmHg — ABNORMAL HIGH (ref 83.0–108.0)
pO2, Arterial: 65.8 mmHg — ABNORMAL LOW (ref 83.0–108.0)

## 2020-01-07 LAB — ECHOCARDIOGRAM COMPLETE
Height: 73.5 in
Weight: 3068.8 oz

## 2020-01-07 LAB — CBG MONITORING, ED: Glucose-Capillary: 174 mg/dL — ABNORMAL HIGH (ref 70–99)

## 2020-01-07 LAB — BASIC METABOLIC PANEL
Anion gap: 11 (ref 5–15)
BUN: 15 mg/dL (ref 6–20)
CO2: 23 mmol/L (ref 22–32)
Calcium: 8.9 mg/dL (ref 8.9–10.3)
Chloride: 103 mmol/L (ref 98–111)
Creatinine, Ser: 1.41 mg/dL — ABNORMAL HIGH (ref 0.61–1.24)
GFR calc Af Amer: >60 mL/min (ref >60)
GFR calc non Af Amer: >60 mL/min (ref >60)
Glucose, Bld: 245 mg/dL — ABNORMAL HIGH (ref 70–99)
Potassium: 3.8 mmol/L (ref 3.5–5.1)
Sodium: 137 mmol/L (ref 135–145)

## 2020-01-07 LAB — HIV ANTIBODY (ROUTINE TESTING W REFLEX): HIV Screen 4th Generation wRfx: NONREACTIVE

## 2020-01-07 LAB — GLUCOSE, CAPILLARY: Glucose-Capillary: 129 mg/dL — ABNORMAL HIGH (ref 70–99)

## 2020-01-07 LAB — APTT: aPTT: 31 seconds (ref 24–36)

## 2020-01-07 LAB — PROTIME-INR
INR: 1.1 (ref 0.8–1.2)
Prothrombin Time: 13.3 seconds (ref 11.4–15.2)

## 2020-01-07 LAB — HEMOGLOBIN A1C
Hgb A1c MFr Bld: 6 % — ABNORMAL HIGH (ref 4.8–5.6)
Mean Plasma Glucose: 125.5 mg/dL

## 2020-01-07 LAB — SARS CORONAVIRUS 2 BY RT PCR (HOSPITAL ORDER, PERFORMED IN ~~LOC~~ HOSPITAL LAB): SARS Coronavirus 2: NEGATIVE

## 2020-01-07 LAB — TSH: TSH: 0.691 u[IU]/mL (ref 0.350–4.500)

## 2020-01-07 LAB — PREPARE RBC (CROSSMATCH)

## 2020-01-07 MED ORDER — IPRATROPIUM-ALBUTEROL 0.5-2.5 (3) MG/3ML IN SOLN: 3.0000 mL | RESPIRATORY_TRACT | Status: DC

## 2020-01-07 MED ORDER — DOCUSATE SODIUM 50 MG/5ML PO LIQD
100.0000 mg | Freq: Two times a day (BID) | ORAL | Status: DC
  Filled 2020-01-07: qty 10

## 2020-01-07 MED ORDER — POLYETHYLENE GLYCOL 3350 17 G PO PACK: 17.0000 g | PACK | Freq: Every day | ORAL | Status: DC

## 2020-01-07 MED ORDER — MIDAZOLAM HCL 2 MG/2ML IJ SOLN
2.0000 mg | INTRAMUSCULAR | Status: DC | PRN
  Filled 2020-01-07: qty 2

## 2020-01-07 MED ORDER — SODIUM CHLORIDE 0.9 % IV SOLN
0.0000 ug/kg/min | INTRAVENOUS | Status: DC
  Administered 2020-01-07 – 2020-01-08 (×2): 3 ug/kg/min via INTRAVENOUS
  Administered 2020-01-08: 2.5 ug/kg/min via INTRAVENOUS
  Filled 2020-01-07 (×4): qty 20

## 2020-01-07 MED ORDER — CHLORHEXIDINE GLUCONATE 0.12% ORAL RINSE (MEDLINE KIT)
15.0000 mL | Freq: Two times a day (BID) | OROMUCOSAL | Status: DC
  Administered 2020-01-07 – 2020-01-09 (×4): 15 mL via OROMUCOSAL

## 2020-01-07 MED ORDER — SODIUM CHLORIDE 0.9% FLUSH
10.0000 mL | INTRAVENOUS | Status: DC | PRN
  Administered 2020-01-12: 40 mL

## 2020-01-07 MED ORDER — CISATRACURIUM BOLUS VIA INFUSION
10.0000 mg | Freq: Once | INTRAVENOUS | Status: AC
  Administered 2020-01-07: 10 mg via INTRAVENOUS
  Filled 2020-01-07: qty 10

## 2020-01-07 MED ORDER — DOCUSATE SODIUM 100 MG PO CAPS: 100.0000 mg | ORAL_CAPSULE | Freq: Two times a day (BID) | ORAL | Status: DC | PRN

## 2020-01-07 MED ORDER — FENTANYL 2500MCG IN NS 250ML (10MCG/ML) PREMIX INFUSION
50.0000 ug/h | INTRAVENOUS | Status: DC
  Administered 2020-01-07: 250 ug/h via INTRAVENOUS
  Administered 2020-01-08: 275 ug/h via INTRAVENOUS
  Administered 2020-01-08: 250 ug/h via INTRAVENOUS
  Administered 2020-01-09: 275 ug/h via INTRAVENOUS
  Filled 2020-01-07 (×4): qty 250

## 2020-01-07 MED ORDER — CHLORHEXIDINE GLUCONATE CLOTH 2 % EX PADS
6.0000 | MEDICATED_PAD | Freq: Every day | CUTANEOUS | Status: DC
  Administered 2020-01-08 – 2020-01-12 (×6): 6 via TOPICAL

## 2020-01-07 MED ORDER — POLYETHYLENE GLYCOL 3350 17 G PO PACK
17.0000 g | PACK | Freq: Every day | ORAL | Status: DC
  Administered 2020-01-08 – 2020-01-09 (×2): 17 g via ORAL
  Filled 2020-01-07 (×2): qty 1

## 2020-01-07 MED ORDER — ORAL CARE MOUTH RINSE
15.0000 mL | OROMUCOSAL | Status: DC
  Administered 2020-01-07 – 2020-01-09 (×18): 15 mL via OROMUCOSAL

## 2020-01-07 MED ORDER — MAGNESIUM SULFATE 2 GM/50ML IV SOLN
2.0000 g | Freq: Once | INTRAVENOUS | Status: AC
  Administered 2020-01-07: 2 g via INTRAVENOUS
  Filled 2020-01-07: qty 50

## 2020-01-07 MED ORDER — FENTANYL BOLUS VIA INFUSION
50.0000 ug | INTRAVENOUS | Status: DC | PRN
  Filled 2020-01-07: qty 50

## 2020-01-07 MED ORDER — VECURONIUM BROMIDE 10 MG IV SOLR: 10.0000 mg | Freq: Once | INTRAVENOUS | Status: DC

## 2020-01-07 MED ORDER — FENTANYL CITRATE (PF) 100 MCG/2ML IJ SOLN
INTRAMUSCULAR | Status: AC
  Filled 2020-01-07: qty 2

## 2020-01-07 MED ORDER — DOCUSATE SODIUM 50 MG/5ML PO LIQD
100.0000 mg | Freq: Two times a day (BID) | ORAL | Status: DC
  Administered 2020-01-08 – 2020-01-09 (×3): 100 mg via ORAL
  Filled 2020-01-07 (×3): qty 10

## 2020-01-07 MED ORDER — FENTANYL CITRATE (PF) 100 MCG/2ML IJ SOLN
50.0000 ug | Freq: Once | INTRAMUSCULAR | Status: AC
  Administered 2020-01-07: 50 ug via INTRAVENOUS

## 2020-01-07 MED ORDER — MIDAZOLAM HCL 2 MG/2ML IJ SOLN
INTRAMUSCULAR | Status: AC | PRN
  Administered 2020-01-07: 2 mg via INTRAVENOUS

## 2020-01-07 MED ORDER — INSULIN ASPART 100 UNIT/ML ~~LOC~~ SOLN
0.0000 [IU] | SUBCUTANEOUS | Status: DC
  Administered 2020-01-07: 3 [IU] via SUBCUTANEOUS
  Administered 2020-01-07: 2 [IU] via SUBCUTANEOUS
  Administered 2020-01-08: 3 [IU] via SUBCUTANEOUS
  Administered 2020-01-08: 2 [IU] via SUBCUTANEOUS
  Administered 2020-01-08: 3 [IU] via SUBCUTANEOUS
  Administered 2020-01-08 – 2020-01-10 (×10): 2 [IU] via SUBCUTANEOUS

## 2020-01-07 MED ORDER — ARTIFICIAL TEARS OPHTHALMIC OINT
1.0000 "application " | TOPICAL_OINTMENT | Freq: Three times a day (TID) | OPHTHALMIC | Status: DC
  Administered 2020-01-07 – 2020-01-09 (×5): 1 via OPHTHALMIC
  Filled 2020-01-07 (×2): qty 3.5

## 2020-01-07 MED ORDER — PROPOFOL 1000 MG/100ML IV EMUL
INTRAVENOUS | Status: AC
  Filled 2020-01-07: qty 100

## 2020-01-07 MED ORDER — MIDAZOLAM HCL 2 MG/2ML IJ SOLN
INTRAMUSCULAR | Status: AC
  Administered 2020-01-07: 16:00:00 2 mg via INTRAVENOUS
  Filled 2020-01-07: qty 2

## 2020-01-07 MED ORDER — FENTANYL CITRATE (PF) 100 MCG/2ML IJ SOLN: 100.0000 ug | INTRAMUSCULAR | Status: DC | PRN

## 2020-01-07 MED ORDER — FENTANYL CITRATE (PF) 100 MCG/2ML IJ SOLN
INTRAMUSCULAR | Status: DC | PRN
  Administered 2020-01-07: 100 ug via INTRAVENOUS

## 2020-01-07 MED ORDER — FENTANYL 2500MCG IN NS 250ML (10MCG/ML) PREMIX INFUSION
50.0000 ug/h | INTRAVENOUS | Status: DC
  Administered 2020-01-07: 50 ug/h via INTRAVENOUS
  Filled 2020-01-07: qty 250

## 2020-01-07 MED ORDER — METHYLPREDNISOLONE SODIUM SUCC 125 MG IJ SOLR
125.0000 mg | Freq: Once | INTRAMUSCULAR | Status: AC
  Administered 2020-01-07: 125 mg via INTRAVENOUS
  Filled 2020-01-07: qty 2

## 2020-01-07 MED ORDER — NOREPINEPHRINE 4 MG/250ML-% IV SOLN
0.0000 ug/min | INTRAVENOUS | Status: DC
  Administered 2020-01-07: 2 ug/min via INTRAVENOUS
  Administered 2020-01-08: 3 ug/min via INTRAVENOUS
  Filled 2020-01-07 (×2): qty 250

## 2020-01-07 MED ORDER — IPRATROPIUM-ALBUTEROL 0.5-2.5 (3) MG/3ML IN SOLN
RESPIRATORY_TRACT | Status: AC
  Administered 2020-01-07: 3 mL via RESPIRATORY_TRACT
  Filled 2020-01-07: qty 9

## 2020-01-07 MED ORDER — ALBUTEROL SULFATE (2.5 MG/3ML) 0.083% IN NEBU
2.5000 mg | INHALATION_SOLUTION | RESPIRATORY_TRACT | Status: DC | PRN
  Administered 2020-01-10 – 2020-01-11 (×5): 2.5 mg via RESPIRATORY_TRACT
  Filled 2020-01-07 (×5): qty 3

## 2020-01-07 MED ORDER — ROCURONIUM BROMIDE 50 MG/5ML IV SOLN
100.0000 mg | Freq: Once | INTRAVENOUS | Status: AC
  Administered 2020-01-07: 100 mg via INTRAVENOUS
  Filled 2020-01-07: qty 10

## 2020-01-07 MED ORDER — KETAMINE HCL 50 MG/5ML IJ SOSY
80.0000 mg | PREFILLED_SYRINGE | Freq: Once | INTRAMUSCULAR | Status: AC
  Administered 2020-01-07: 80 mg via INTRAVENOUS
  Filled 2020-01-07: qty 10

## 2020-01-07 MED ORDER — ALBUTEROL SULFATE (2.5 MG/3ML) 0.083% IN NEBU
10.0000 mg | INHALATION_SOLUTION | Freq: Once | RESPIRATORY_TRACT | Status: AC
  Administered 2020-01-07: 10 mg via RESPIRATORY_TRACT
  Filled 2020-01-07: qty 12

## 2020-01-07 MED ORDER — HEPARIN SODIUM (PORCINE) 5000 UNIT/ML IJ SOLN
5000.0000 [IU] | Freq: Three times a day (TID) | INTRAMUSCULAR | Status: DC
  Administered 2020-01-07 – 2020-01-13 (×17): 5000 [IU] via SUBCUTANEOUS
  Filled 2020-01-07 (×17): qty 1

## 2020-01-07 MED ORDER — PROPOFOL 1000 MG/100ML IV EMUL
25.0000 ug/kg/min | INTRAVENOUS | Status: DC
  Administered 2020-01-07: 25 ug/kg/min via INTRAVENOUS
  Administered 2020-01-07: 35 ug/kg/min via INTRAVENOUS
  Administered 2020-01-08: 50 ug/kg/min via INTRAVENOUS
  Administered 2020-01-08: 40 ug/kg/min via INTRAVENOUS
  Administered 2020-01-08 (×2): 45 ug/kg/min via INTRAVENOUS
  Administered 2020-01-08: 50 ug/kg/min via INTRAVENOUS
  Administered 2020-01-09 (×2): 40 ug/kg/min via INTRAVENOUS
  Filled 2020-01-07 (×9): qty 100
  Filled 2020-01-07: qty 200

## 2020-01-07 MED ORDER — LACTATED RINGERS IV SOLN: INTRAVENOUS | Status: DC

## 2020-01-07 MED ORDER — IPRATROPIUM-ALBUTEROL 0.5-2.5 (3) MG/3ML IN SOLN
3.0000 mL | RESPIRATORY_TRACT | Status: AC
  Administered 2020-01-07 (×2): 3 mL via RESPIRATORY_TRACT
  Filled 2020-01-07: qty 3

## 2020-01-07 MED ORDER — MIDAZOLAM HCL 2 MG/2ML IJ SOLN
2.0000 mg | INTRAMUSCULAR | Status: DC | PRN
  Administered 2020-01-07: 2 mg via INTRAVENOUS
  Filled 2020-01-07: qty 2

## 2020-01-07 MED ORDER — METHYLPREDNISOLONE SODIUM SUCC 125 MG IJ SOLR
60.0000 mg | Freq: Three times a day (TID) | INTRAMUSCULAR | Status: DC
  Administered 2020-01-07 – 2020-01-10 (×9): 60 mg via INTRAVENOUS
  Filled 2020-01-07 (×9): qty 2

## 2020-01-07 MED ORDER — MIDAZOLAM 50MG/50ML (1MG/ML) PREMIX INFUSION
0.0000 mg/h | INTRAVENOUS | Status: DC
  Administered 2020-01-07: 2 mg/h via INTRAVENOUS
  Administered 2020-01-08: 5 mg/h via INTRAVENOUS
  Administered 2020-01-08 (×3): 7 mg/h via INTRAVENOUS
  Administered 2020-01-09: 6 mg/h via INTRAVENOUS
  Filled 2020-01-07 (×6): qty 50

## 2020-01-07 MED ORDER — POLYETHYLENE GLYCOL 3350 17 G PO PACK: 17.0000 g | PACK | Freq: Every day | ORAL | Status: DC | PRN

## 2020-01-07 MED ORDER — IPRATROPIUM-ALBUTEROL 0.5-2.5 (3) MG/3ML IN SOLN
3.0000 mL | RESPIRATORY_TRACT | Status: DC
  Administered 2020-01-07 – 2020-01-10 (×17): 3 mL via RESPIRATORY_TRACT
  Filled 2020-01-07 (×18): qty 3

## 2020-01-07 MED ORDER — SODIUM CHLORIDE 0.9 % IV SOLN
1.0000 mg/kg/h | INTRAVENOUS | Status: DC
  Administered 2020-01-07 – 2020-01-09 (×8): 1 mg/kg/h via INTRAVENOUS
  Filled 2020-01-07 (×12): qty 5

## 2020-01-07 MED ORDER — MIDAZOLAM BOLUS VIA INFUSION
1.0000 mg | INTRAVENOUS | Status: DC | PRN
  Filled 2020-01-07: qty 2

## 2020-01-07 MED ORDER — ETOMIDATE 2 MG/ML IV SOLN
INTRAVENOUS | Status: AC | PRN
  Administered 2020-01-07: 20 mg via INTRAVENOUS

## 2020-01-07 MED ORDER — PANTOPRAZOLE SODIUM 40 MG IV SOLR
40.0000 mg | Freq: Every day | INTRAVENOUS | Status: DC
  Administered 2020-01-07 – 2020-01-09 (×3): 40 mg via INTRAVENOUS
  Filled 2020-01-07 (×3): qty 40

## 2020-01-07 MED ORDER — FENTANYL CITRATE (PF) 100 MCG/2ML IJ SOLN
INTRAMUSCULAR | Status: AC
  Administered 2020-01-07: 13:00:00 100 ug via INTRAVENOUS
  Filled 2020-01-07: qty 2

## 2020-01-07 MED ORDER — MIDAZOLAM HCL 2 MG/2ML IJ SOLN: 2.0000 mg | INTRAMUSCULAR | Status: DC | PRN

## 2020-01-07 MED ORDER — SODIUM CHLORIDE 0.9% FLUSH
10.0000 mL | Freq: Two times a day (BID) | INTRAVENOUS | Status: DC
  Administered 2020-01-07 – 2020-01-08 (×2): 10 mL
  Administered 2020-01-10: 30 mL
  Administered 2020-01-10: 10 mL
  Administered 2020-01-11: 30 mL
  Administered 2020-01-11 – 2020-01-13 (×3): 10 mL

## 2020-01-07 NOTE — Progress Notes (Signed)
  Echocardiogram 2D Echocardiogram has been performed.  Delcie Roch 01/07/2020, 3:26 PM

## 2020-01-07 NOTE — Progress Notes (Signed)
RT note-fio2 decreased to 60%, post ABG [id number]

## 2020-01-07 NOTE — H&P (Signed)
NAME:  Isaac Estrada, MRN:  [id number], DOB:  November 13, 1980, LOS: 0 ADMISSION DATE:  01/07/2020, CONSULTATION DATE:  01/07/2020 REFERRING MD:  Dr. Anitra Lauth, CHIEF COMPLAINT:  Severe asthma attack   Brief History   38yo male presented to ED after being intubated in the field after suffering a severe asthma attack. PCCM consulted for further assistance and admission  History of present illness   Isaac Estrada is a 39yo male with past medical history of asthma but no other medical history who presented to ED intubated in the field due to a severe asthma attack. History obtain from patients spouse as patient is currently sedated and on ventilator. Per wife patient had preformed yard work yesterday per his usual and made a trip to [employer] to obtain yard care supplies and after retuning home he began experiencing shortness of breath. Patient then called his mom stating he was having progressive shortness of breath due to an asthma attack and on her arrival patient was in severe respiratory distress therefore she called 12. 2-63mins prior to their arrival patient became apneic with oxygen saturations 50% on EMS arrival, he was intubated and transported to the ED.   On arrival patient was seen tachypneic and tachycardia. ABG on arrival 7.168 / 67.7 / 206 / 24.5. Full lab work pending but CBC and partial bmet within normal limits. PCCM consulted on admission for further management and admission  Past Medical History  Asthma   Significant Hospital Events   01/07/2020 Admitted and intubated in field   Consults:  ECMO   Procedures:    Significant Diagnostic Tests:  CXR 5/12 > Endotracheal tube in place.  Clear lungs.  Micro Data:  COVID 5/12 > RVP 5/12 >  Antimicrobials:     Interim history/subjective:  Lying on ED stretcher, sedated   Objective   Blood pressure 137/71, pulse (!) 107, temperature (!) 97.4 F (36.3 C), temperature source Temporal, resp. rate (!) 26, height 6' 1.5" (1.867  m), weight 87 kg, SpO2 100 %.    Vent Mode: PCV FiO2 (%):  [100 %] 100 % Set Rate:  [24 bmp] 24 bmp PEEP:  [5 cmH20] 5 cmH20 Plateau Pressure:  [16 cmH20] 16 cmH20  No intake or output data in the 24 hours ending 01/07/20 1308 Filed Weights   01/07/20 1223  Weight: 87 kg    Examination: General: Adult well appearing adult male on mechanical ventilation  HEENT: ETT, MM pink/moist, PERRL, Sclera non-icteric  Neuro: Sedated on vent  CV: s1s2 regular rate and rhythm, no murmur, rubs, or gallops,  PULM:  Severe bilateral wheezing in all fields, decreased air entry bilaterally,  GI: soft, bowel sounds active in all 4 quadrants, non-tender, non-distended Extremities: warm/dry, no edema  Skin: no rashes or lesions  Resolved Hospital Problem list     Assessment & Plan:  Status asthmaticus Acute hypoxic and hypercarbic respiratory failure Acute respiratory acidosis  P:  Full MV support, PCV for now to maximize TV PAD protocol for RASS goal -4/-5 with ketamine and fentanyl gtt with bowel regimen Rocuronium x 1 now, may need nimbex gtt Trend CXR Insert Aline, ABG now continuous albuterol neb x 1hr then duonebs q4, prn albuterol Additional Mag 2gm now Continue solumedrol 60mg  q 6 Assess RVP ECMO consult placed in the event he doesn't respond to interventions above  AKI P:  S/p 500 ml bolus, MIVF LR at 50 ml Insert foley given need for paralytics  Trend BMP, Mag in am   Hyperglycemia -  likely reactive in acute setting  P:  CBG q 4  Best practice:  Diet: NPO Pain/Anxiety/Delirium protocol (if indicated): Fentanyl, ketamine,  VAP protocol (if indicated): In place DVT prophylaxis: Lovenox GI prophylaxis: PPI Glucose control: Monitor  Mobility: Bedrest Code Status: Full Family Communication: Updated at bedside  Disposition: ICU   Labs   CBC: Recent Labs  Lab 01/07/20 1227 01/07/20 1233  WBC 6.2  --   NEUTROABS 2.0  --   HGB 13.3 13.6  HCT 42.1 40.0  MCV 88.3   --   PLT 309  --     Basic Metabolic Panel: Recent Labs  Lab 01/07/20 1233  NA 139  K 3.6   GFR: CrCl cannot be calculated (No successful lab value found.). Recent Labs  Lab 01/07/20 1227  WBC 6.2    Liver Function Tests: No results for input(s): AST, ALT, ALKPHOS, BILITOT, PROT, ALBUMIN in the last 168 hours. No results for input(s): LIPASE, AMYLASE in the last 168 hours. No results for input(s): AMMONIA in the last 168 hours.  ABG    Component Value Date/Time   HCO3 24.5 01/07/2020 1233   TCO2 27 01/07/2020 1233   ACIDBASEDEF 5.0 (H) 01/07/2020 1233   O2SAT 99.0 01/07/2020 1233     Coagulation Profile: No results for input(s): INR, PROTIME in the last 168 hours.  Cardiac Enzymes: No results for input(s): CKTOTAL, CKMB, CKMBINDEX, TROPONINI in the last 168 hours.  HbA1C: No results found for: HGBA1C  CBG: No results for input(s): GLUCAP in the last 168 hours.  Review of Systems:   Unable to gather due to acute illness and intubation   Past Medical History  He,  has a past medical history of Allergy and Asthma.   Surgical History   No past surgical history on file.   Social History   reports that he has never smoked. He has never used smokeless tobacco. He reports that he does not drink alcohol or use drugs.   Family History   His family history includes Asthma in his daughter, daughter, and son; Diabetes in his mother.   Allergies No Known Allergies   Home Medications  Prior to Admission medications   Medication Sig Start Date End Date Taking? Authorizing Provider  Albuterol Sulfate (PROAIR RESPICLICK) 108 (90 Base) MCG/ACT AEPB Inhale 1 Inhaler into the lungs every 4 (four) hours as needed. 07/24/16   Shade Flood, MD  budesonide-formoterol [facility]) 80-4.5 MCG/ACT inhaler Inhale 2 puffs into the lungs 2 (two) times daily. 07/24/16   Shade Flood, MD  ciprofloxacin (CIPRO) 500 MG tablet Take 1 tablet (500 mg total) by mouth 2 (two)  times daily. 06/16/19   Shade Flood, MD     Critical care time:   CRITICAL CARE Performed by: Delfin Gant   Total critical care time: 50 minutes  Critical care time was exclusive of separately billable procedures and treating other patients.  Critical care was necessary to treat or prevent imminent or life-threatening deterioration.  Critical care was time spent personally by me on the following activities: development of treatment plan with patient and/or surrogate as well as nursing, discussions with consultants, evaluation of patient's response to treatment, examination of patient, obtaining history from patient or surrogate, ordering and performing treatments and interventions, ordering and review of laboratory studies, ordering and review of radiographic studies, pulse oximetry and re-evaluation of patient's condition.  Delfin Gant, NP-C [facility] Contact / Pager information can be found  on Amion  01/07/2020, 1:32 PM

## 2020-01-07 NOTE — ED Notes (Signed)
Belongings with family

## 2020-01-07 NOTE — ED Notes (Signed)
Belongings taken by family

## 2020-01-07 NOTE — Discharge Planning (Signed)
RNCM met with family in consultation room A.  RNCM provided tissues and support as family was crying and asking for prayer.  RNCM remained with family for about 15 minutes until more family support arrived. 

## 2020-01-07 NOTE — ED Provider Notes (Signed)
[facility] Provider Note   CSN: [id number] Arrival date & time: 01/07/20  1217     History Chief Complaint  Patient presents with  . Respiratory Distress    [facility] is a 39 y.o. male.  Patient is a 39 year old male with a history of asthma but no other significant medical problems.  Patient does not smoke or use drugs.  History was obtained by his mom who reported that he called her today stating he was having a bad asthma attack and he could not breathe.  She reports shortly after he arrived at the house and she had his inhaler however he was clutching his chest stating he could not breathe and that he was going to die.  She tried to give him his inhaler but he was not able to get any medicine in.  She then called 911.  She reports he started having more more difficulty and approximately 2 to 3 minutes before EMS arrived she noticed he had stopped breathing.  Paramedics reported that upon their arrival he was satting 50% and breathing agonal he.  They started bagging him without significant improvement.  He was intubated in the field and in route did receive fentanyl and Versed due to biting the tube and coughing.  He has not completely woken up or following commands at this time.  They started magnesium just prior to arrival.  Mom reported no known history of productive cough, fever or recent infection.  The history is provided by the EMS personnel and a parent.       Past Medical History:  Diagnosis Date  . Allergy   . Asthma     Patient Active Problem List   Diagnosis Date Noted  . Asthma 12/30/2012  . Perennial allergic rhinitis with seasonal variation 12/30/2012    No past surgical history on file.     Family History  Problem Relation Age of Onset  . Diabetes Mother   . Asthma Daughter   . Asthma Son   . Asthma Daughter     Social History   Tobacco Use  . Smoking status: Never Smoker  . Smokeless tobacco: Never Used   Substance Use Topics  . Alcohol use: No  . Drug use: No    Home Medications Prior to Admission medications   Medication Sig Start Date End Date Taking? Authorizing Provider  Albuterol Sulfate (PROAIR RESPICLICK) 108 (90 Base) MCG/ACT AEPB Inhale 1 Inhaler into the lungs every 4 (four) hours as needed. 07/24/16   Shade Flood, MD  budesonide-formoterol [place] [facility]) 80-4.5 MCG/ACT inhaler Inhale 2 puffs into the lungs 2 (two) times daily. 07/24/16   Shade Flood, MD  ciprofloxacin (CIPRO) 500 MG tablet Take 1 tablet (500 mg total) by mouth 2 (two) times daily. 06/16/19   Shade Flood, MD    Allergies    Patient has no known allergies.  Review of Systems   Review of Systems  Unable to perform ROS: Acuity of condition    Physical Exam Updated Vital Signs BP (!) 142/85   Pulse (!) 101   Temp (!) 97.4 F (36.3 C) (Temporal)   Resp (!) 30   Ht 6' 1.5" (1.867 m)   Wt 87 kg   SpO2 100%   BMI 24.96 kg/m   Physical Exam Vitals and nursing note reviewed.  Constitutional:      Appearance: He is well-developed.     Interventions: He is intubated.     Comments: Patient  currently intubated and sedated.  Biting the tube.  HENT:     Head: Normocephalic and atraumatic.  Eyes:     Conjunctiva/sclera: Conjunctivae normal.     Pupils: Pupils are equal, round, and reactive to light.  Cardiovascular:     Rate and Rhythm: Regular rhythm. Tachycardia present.     Heart sounds: No murmur.  Pulmonary:     Effort: Accessory muscle usage present. He is intubated.     Breath sounds: Decreased air movement present.  Abdominal:     General: There is no distension.     Palpations: Abdomen is soft.     Tenderness: There is no abdominal tenderness. There is no guarding or rebound.  Musculoskeletal:        General: No tenderness. Normal range of motion.     Cervical back: Normal range of motion and neck supple.     Right lower leg: No edema.     Left lower leg: No edema.   Skin:    General: Skin is warm and dry.     Findings: No erythema or rash.  Neurological:     Mental Status: He is unresponsive.     Comments: Biting at the tube but does not open eyes or follow commands  Psychiatric:     Comments: Intubated and on ventilator with sedation      ED Results / Procedures / Treatments   Labs (all labs ordered are listed, but only abnormal results are displayed) Labs Reviewed  POCT I-STAT EG7 - Abnormal; Notable for the following components:      Result Value   pH, Ven 7.168 (*)    pCO2, Ven 67.7 (*)    pO2, Ven 206.0 (*)    Acid-base deficit 5.0 (*)    All other components within normal limits  SARS CORONAVIRUS 2 BY RT PCR ([facility] ORDER, PERFORMED IN  [facility])  CBC WITH DIFFERENTIAL/PLATELET  BASIC METABOLIC PANEL  I-STAT VENOUS BLOOD GAS, ED    EKG EKG Interpretation  Date/Time:  Wednesday Jan 07 2020 12:23:43 EDT Ventricular Rate:  102 PR Interval:    QRS Duration: 86 QT Interval:  342 QTC Calculation: 446 R Axis:   75 Text Interpretation: Sinus tachycardia Borderline repolarization abnormality No previous tracing Confirmed by Gwyneth Sprout ([id number]) on 01/07/2020 12:26:31 PM   Radiology DG Chest Portable 1 View  Result Date: 01/07/2020 CLINICAL DATA:  Intubation EXAM: PORTABLE CHEST 1 VIEW COMPARISON:  None. FINDINGS: Endotracheal tube is approximately 6 cm above the carina. Lungs are clear. No pleural effusion or pneumothorax. Cardiomediastinal contours are within normal limits with normal heart size. IMPRESSION: Endotracheal tube in place.  Clear lungs. Electronically Signed   By: Guadlupe Spanish M.D.   On: 01/07/2020 12:39    Procedures Procedures (including critical care time)  Medications Ordered in ED Medications  fentaNYL (SUBLIMAZE) injection 100 mcg (has no administration in time range)  fentaNYL (SUBLIMAZE) injection 100 mcg (has no administration in time range)  midazolam (VERSED) injection 2 mg  (has no administration in time range)  midazolam (VERSED) injection 2 mg (has no administration in time range)  midazolam (VERSED) injection (2 mg Intravenous Given 01/07/20 1225)  ipratropium-albuterol (DUONEB) 0.5-2.5 (3) MG/3ML nebulizer solution 3 mL (3 mLs Nebulization Given 01/07/20 1236)  fentaNYL (SUBLIMAZE) injection (100 mcg Intravenous Given 01/07/20 1225)  methylPREDNISolone sodium succinate (SOLU-MEDROL) 125 mg/2 mL injection 125 mg (125 mg Intravenous Given 01/07/20 1235)  ketamine 50 mg in normal saline 5 mL (10 mg/mL) syringe (  80 mg Intravenous Given 01/07/20 1238)    ED Course  I have reviewed the triage vital signs and the nursing notes.  Pertinent labs & imaging results that were available during my care of the patient were reviewed by me and considered in my medical decision making (see chart for details).    MDM Rules/Calculators/A&P                      39 year old male presenting today after respiratory failure requiring intubation in the field due to hypoxemia.  Patient suspected to have severe asthma attack.  He is currently tachypneic with significantly decreased breath sounds.  No significant wheezing at this time.  Patient given magnesium, Solu-Medrol and duo nebs every 15 minutes.  Chest x-ray without evidence of pneumothorax and EKG is within normal limits.  No report from family of recent illness.  Covid is pending.  VBG shows pH of 7.16 with a CO2 of 67.  Patient's oxygen saturation remains 100% however patient has poor tidal volumes on ventilator from 180-250.  Peak pressure is 35 and PEEP is at 5 currently.  Will decrease respiratory rate but concern for auto PEEP.  Patient was given Versed and fentanyl however continues to bite the tube and was given a dose of ketamine.  Discussed findings and care with critical care.  Discussed with family who are aware of what is going on.  Patient will be admitted to the ICU.  ET tube currently in appropriate location based on  x-ray.  MDM Number of Diagnoses or Management Options   Amount and/or Complexity of Data Reviewed Clinical lab tests: ordered and reviewed Tests in the radiology section of CPT: ordered and reviewed Tests in the medicine section of CPT: ordered and reviewed Decide to obtain previous medical records or to obtain history from someone other than the patient: yes Obtain history from someone other than the patient: yes Review and summarize past medical records: yes Discuss the patient with other providers: yes Independent visualization of images, tracings, or specimens: yes  Risk of Complications, Morbidity, and/or Mortality Presenting problems: high Diagnostic procedures: high Management options: high  Patient Progress Patient progress: stable  CRITICAL CARE Performed by: Weslynn Ke Total critical care time: 30 minutes Critical care time was exclusive of separately billable procedures and treating other patients. Critical care was necessary to treat or prevent imminent or life-threatening deterioration. Critical care was time spent personally by me on the following activities: development of treatment plan with patient and/or surrogate as well as nursing, discussions with consultants, evaluation of patient's response to treatment, examination of patient, obtaining history from patient or surrogate, ordering and performing treatments and interventions, ordering and review of laboratory studies, ordering and review of radiographic studies, pulse oximetry and re-evaluation of patient's condition.  Final Clinical Impression(s) / ED Diagnoses Final diagnoses:  Severe persistent asthma with exacerbation  Acute respiratory failure with hypoxia and hypercapnia [facility])    Rx / DC Orders ED Discharge Orders    None       Blanchie Dessert, MD 01/07/20 1251

## 2020-01-07 NOTE — Progress Notes (Signed)
Peripherally Inserted Central Catheter Placement  The IV Nurse has discussed with the patient and/or persons authorized to consent for the patient, the purpose of this procedure and the potential benefits and risks involved with this procedure.  The benefits include less needle sticks, lab draws from the catheter, and the patient may be discharged home with the catheter. Risks include, but not limited to, infection, bleeding, blood clot (thrombus formation), and puncture of an artery; nerve damage and irregular heartbeat and possibility to perform a PICC exchange if needed/ordered by physician.  Alternatives to this procedure were also discussed.  Bard Power PICC patient education guide, fact sheet on infection prevention and patient information card has been provided to patient /or left at bedside.  Consent signed by wife PICC Placement Documentation  PICC Triple Lumen 01/07/20 PICC Left Brachial 49 cm 0 cm (Active)  Indication for Insertion or Continuance of Line Vasoactive infusions 01/07/20 1852  Exposed Catheter (cm) 0 cm 01/07/20 1852  Site Assessment Clean;Dry;Intact 01/07/20 1852  Lumen #1 Status Flushed;Saline locked;Blood return noted 01/07/20 1852  Lumen #2 Status Flushed;Saline locked;Blood return noted 01/07/20 1852  Lumen #3 Status Flushed;Saline locked;Blood return noted 01/07/20 1852  Dressing Type Transparent 01/07/20 1852  Dressing Status Clean;Dry;Intact;Antimicrobial disc in place 01/07/20 1852  Dressing Intervention New dressing 01/07/20 1852  Dressing Change Due 01/14/20 01/07/20 1852       Ethelda Chick 01/07/2020, 6:53 PM

## 2020-01-07 NOTE — ED Triage Notes (Signed)
Pt arrives via EMS where pt was found unresponsive on front porch. Family reports pt was complaining of SOB today and no help with inhaler. EMS reports no breath sounds, no gag with OPA. Pt intubated on arrival. EMS gave 0.3 IM epi, 50 mcg fentanyl and midazolam.

## 2020-01-07 NOTE — Procedures (Signed)
Arterial Catheter Insertion Procedure Note [facility] [id number] 02-12-1981  Procedure: Insertion of Arterial Catheter  Indications: Blood pressure monitoring and Frequent blood sampling  Procedure Details Consent: Risks of procedure as well as the alternatives and risks of each were explained to the (patient/caregiver).  Consent for procedure obtained. Time Out: Verified patient identification, verified procedure, site/side was marked, verified correct patient position, special equipment/implants available, medications/allergies/relevent history reviewed, required imaging and test results available.  Performed  Maximum sterile technique was used including antiseptics, gloves, hand hygiene, mask and sheet. Skin prep: Chlorhexidine; local anesthetic administered 20 gauge catheter was inserted into right radial artery using the Seldinger technique. ULTRASOUND GUIDANCE USED: NO Evaluation Blood flow good; BP tracing good. Complications: No apparent complications.  Delfin Gant, NP-C [facility] Contact / Pager information can be found on Amion  01/07/2020, 2:01 PM

## 2020-01-07 NOTE — Progress Notes (Signed)
Critical ABG results reported to Dr. Katrinka Blazing. No changes made at this time.

## 2020-01-08 ENCOUNTER — Inpatient Hospital Stay (HOSPITAL_COMMUNITY): Payer: BLUE CROSS/BLUE SHIELD

## 2020-01-08 ENCOUNTER — Encounter: Payer: Self-pay | Admitting: Internal Medicine

## 2020-01-08 LAB — BASIC METABOLIC PANEL
Anion gap: 7 (ref 5–15)
Anion gap: 9 (ref 5–15)
BUN: 14 mg/dL (ref 6–20)
BUN: 13 mg/dL (ref 6–20)
CO2: 24 mmol/L (ref 22–32)
CO2: 26 mmol/L (ref 22–32)
Calcium: 7.9 mg/dL — ABNORMAL LOW (ref 8.9–10.3)
Calcium: 7.6 mg/dL — ABNORMAL LOW (ref 8.9–10.3)
Chloride: 107 mmol/L (ref 98–111)
Chloride: 106 mmol/L (ref 98–111)
Creatinine, Ser: 1.35 mg/dL — ABNORMAL HIGH (ref 0.61–1.24)
Creatinine, Ser: 1.19 mg/dL (ref 0.61–1.24)
GFR calc Af Amer: >60 mL/min (ref >60)
GFR calc Af Amer: >60 mL/min (ref >60)
GFR calc non Af Amer: >60 mL/min (ref >60)
GFR calc non Af Amer: >60 mL/min (ref >60)
Glucose, Bld: 152 mg/dL — ABNORMAL HIGH (ref 70–99)
Glucose, Bld: 152 mg/dL — ABNORMAL HIGH (ref 70–99)
Potassium: 5.7 mmol/L — ABNORMAL HIGH (ref 3.5–5.1)
Potassium: 3.4 mmol/L — ABNORMAL LOW (ref 3.5–5.1)
Sodium: 138 mmol/L (ref 135–145)
Sodium: 141 mmol/L (ref 135–145)

## 2020-01-08 LAB — POCT I-STAT 7, (LYTES, BLD GAS, ICA,H+H)
Acid-Base Excess: 5 mmol/L — ABNORMAL HIGH (ref 0.0–2.0)
Acid-base deficit: 2 mmol/L (ref 0.0–2.0)
Acid-base deficit: 1 mmol/L (ref 0.0–2.0)
Bicarbonate: 27.9 mmol/L (ref 20.0–28.0)
Bicarbonate: 23.5 mmol/L (ref 20.0–28.0)
Bicarbonate: 33.2 mmol/L — ABNORMAL HIGH (ref 20.0–28.0)
Calcium, Ion: 1.22 mmol/L (ref 1.15–1.40)
Calcium, Ion: 1.07 mmol/L — ABNORMAL LOW (ref 1.15–1.40)
Calcium, Ion: 1.21 mmol/L (ref 1.15–1.40)
HCT: 39 % (ref 39.0–52.0)
HCT: 29 % — ABNORMAL LOW (ref 39.0–52.0)
HCT: 33 % — ABNORMAL LOW (ref 39.0–52.0)
Hemoglobin: 13.3 g/dL (ref 13.0–17.0)
Hemoglobin: 9.9 g/dL — ABNORMAL LOW (ref 13.0–17.0)
Hemoglobin: 11.2 g/dL — ABNORMAL LOW (ref 13.0–17.0)
O2 Saturation: 100 %
O2 Saturation: 100 %
O2 Saturation: 100 %
Patient temperature: 37.6
Patient temperature: 36.9
Patient temperature: 36.7
Potassium: 5.6 mmol/L — ABNORMAL HIGH (ref 3.5–5.1)
Potassium: 3.1 mmol/L — ABNORMAL LOW (ref 3.5–5.1)
Potassium: 4.8 mmol/L (ref 3.5–5.1)
Sodium: 137 mmol/L (ref 135–145)
Sodium: 145 mmol/L (ref 135–145)
Sodium: 141 mmol/L (ref 135–145)
TCO2: 30 mmol/L (ref 22–32)
TCO2: 25 mmol/L (ref 22–32)
TCO2: 35 mmol/L — ABNORMAL HIGH (ref 22–32)
pCO2 arterial: 77.3 mmHg (ref 32.0–48.0)
pCO2 arterial: 38.8 mmHg (ref 32.0–48.0)
pCO2 arterial: 63.5 mmHg — ABNORMAL HIGH (ref 32.0–48.0)
pH, Arterial: 7.169 — CL (ref 7.350–7.450)
pH, Arterial: 7.389 (ref 7.350–7.450)
pH, Arterial: 7.325 — ABNORMAL LOW (ref 7.350–7.450)
pO2, Arterial: 230 mmHg — ABNORMAL HIGH (ref 83.0–108.0)
pO2, Arterial: 205 mmHg — ABNORMAL HIGH (ref 83.0–108.0)
pO2, Arterial: 223 mmHg — ABNORMAL HIGH (ref 83.0–108.0)

## 2020-01-08 LAB — CBC
HCT: 41.2 % (ref 39.0–52.0)
Hemoglobin: 12.7 g/dL — ABNORMAL LOW (ref 13.0–17.0)
MCH: 27.3 pg (ref 26.0–34.0)
MCHC: 30.8 g/dL (ref 30.0–36.0)
MCV: 88.4 fL (ref 80.0–100.0)
Platelets: 272 10*3/uL (ref 150–400)
RBC: 4.66 MIL/uL (ref 4.22–5.81)
RDW: 13 % (ref 11.5–15.5)
WBC: 12.4 10*3/uL — ABNORMAL HIGH (ref 4.0–10.5)
nRBC: 0 % (ref 0.0–0.2)

## 2020-01-08 LAB — GLUCOSE, CAPILLARY
Glucose-Capillary: 126 mg/dL — ABNORMAL HIGH (ref 70–99)
Glucose-Capillary: 133 mg/dL — ABNORMAL HIGH (ref 70–99)
Glucose-Capillary: 137 mg/dL — ABNORMAL HIGH (ref 70–99)
Glucose-Capillary: 145 mg/dL — ABNORMAL HIGH (ref 70–99)
Glucose-Capillary: 149 mg/dL — ABNORMAL HIGH (ref 70–99)
Glucose-Capillary: 163 mg/dL — ABNORMAL HIGH (ref 70–99)
Glucose-Capillary: 168 mg/dL — ABNORMAL HIGH (ref 70–99)

## 2020-01-08 LAB — MAGNESIUM
Magnesium: 2.1 mg/dL (ref 1.7–2.4)
Magnesium: 2.3 mg/dL (ref 1.7–2.4)

## 2020-01-08 LAB — LACTIC ACID, PLASMA: Lactic Acid, Venous: 2.8 mmol/L (ref 0.5–1.9)

## 2020-01-08 LAB — TRIGLYCERIDES: Triglycerides: 110 mg/dL (ref <150)

## 2020-01-08 LAB — PHOSPHORUS
Phosphorus: 4.3 mg/dL (ref 2.5–4.6)
Phosphorus: 3.5 mg/dL (ref 2.5–4.6)

## 2020-01-08 MED ORDER — PRO-STAT SUGAR FREE PO LIQD
30.0000 mL | Freq: Two times a day (BID) | ORAL | Status: DC
  Administered 2020-01-08: 30 mL
  Filled 2020-01-08: qty 30

## 2020-01-08 MED ORDER — MONTELUKAST SODIUM 10 MG PO TABS
10.0000 mg | ORAL_TABLET | Freq: Every day | ORAL | Status: DC
  Administered 2020-01-08 – 2020-01-12 (×3): 10 mg via ORAL
  Filled 2020-01-08 (×5): qty 1

## 2020-01-08 MED ORDER — PRO-STAT SUGAR FREE PO LIQD
30.0000 mL | Freq: Four times a day (QID) | ORAL | Status: DC
  Administered 2020-01-08 – 2020-01-09 (×4): 30 mL
  Filled 2020-01-08 (×4): qty 30

## 2020-01-08 MED ORDER — SODIUM CHLORIDE 0.9 % IV SOLN: INTRAVENOUS | Status: DC

## 2020-01-08 MED ORDER — HYDRALAZINE HCL 20 MG/ML IJ SOLN
2.0000 mg | INTRAMUSCULAR | Status: DC | PRN
  Administered 2020-01-08 – 2020-01-10 (×4): 2 mg via INTRAVENOUS
  Filled 2020-01-08 (×4): qty 1

## 2020-01-08 MED ORDER — LORATADINE 10 MG PO TABS
10.0000 mg | ORAL_TABLET | Freq: Every day | ORAL | Status: DC
  Administered 2020-01-08 – 2020-01-13 (×4): 10 mg via ORAL
  Filled 2020-01-08 (×4): qty 1

## 2020-01-08 MED ORDER — RACEPINEPHRINE HCL 2.25 % IN NEBU: 1.5000 mL | INHALATION_SOLUTION | Freq: Once | RESPIRATORY_TRACT | Status: AC

## 2020-01-08 MED ORDER — VITAL AF 1.2 CAL PO LIQD
1000.0000 mL | ORAL | Status: DC
  Administered 2020-01-08: 1000 mL

## 2020-01-08 MED ORDER — RACEPINEPHRINE HCL 2.25 % IN NEBU
INHALATION_SOLUTION | RESPIRATORY_TRACT | Status: AC
  Administered 2020-01-08: 1.5 mL via RESPIRATORY_TRACT
  Filled 2020-01-08: qty 1.5

## 2020-01-08 MED ORDER — BUDESONIDE 0.5 MG/2ML IN SUSP
0.5000 mg | Freq: Two times a day (BID) | RESPIRATORY_TRACT | Status: DC
  Administered 2020-01-08 – 2020-01-13 (×11): 0.5 mg via RESPIRATORY_TRACT
  Filled 2020-01-08 (×11): qty 2

## 2020-01-08 MED ORDER — VITAL HIGH PROTEIN PO LIQD: 1000.0000 mL | ORAL | Status: DC

## 2020-01-08 MED ORDER — SODIUM ZIRCONIUM CYCLOSILICATE 10 G PO PACK
10.0000 g | PACK | Freq: Once | ORAL | Status: AC
  Administered 2020-01-08: 10 g
  Filled 2020-01-08 (×2): qty 1

## 2020-01-08 MED ORDER — NICARDIPINE HCL IN NACL 20-0.86 MG/200ML-% IV SOLN
3.0000 mg/h | INTRAVENOUS | Status: DC
  Filled 2020-01-08: qty 200

## 2020-01-08 MED ORDER — SODIUM BICARBONATE 8.4 % IV SOLN
100.0000 meq | Freq: Once | INTRAVENOUS | Status: AC
  Administered 2020-01-08: 100 meq via INTRAVENOUS
  Filled 2020-01-08: qty 100

## 2020-01-08 NOTE — Progress Notes (Signed)
PULMONARY / CRITICAL CARE MEDICINE   Name: Isaac Estrada MRN: [id number] DOB: 1980/12/04    ADMISSION DATE:  01/07/2020 CONSULTATION DATE: 01/07/20 LOS 1  REFERRING MD:  Dr. Maryan Rued CHIEF COMPLAINT:  Severe asthma attack  HISTORY OF PRESENT ILLNESS:   Isaac Estrada is a 39yo male with past medical history of asthma but no other medical history who presented to ED intubated in the field due to a severe asthma attack. History obtain from patients spouse as patient is currently sedated and on ventilator. Per wife patient had preformed yard work yesterday per his usual and made a trip to [employer] to obtain yard care supplies and after retuning home he began experiencing shortness of breath. Patient then called his mom stating he was having progressive shortness of breath due to an asthma attack and on her arrival patient was in severe respiratory distress therefore she called 85. 2-66mns prior to their arrival patient became apneic with oxygen saturations 50% on EMS arrival, he was intubated and transported to the ED.   On arrival patient was seen tachypneic and tachycardia. ABG on arrival 7.168 / 67.7 / 206 / 24.5. Full lab work pending but CBC and partial bmet within normal limits. PCCM consulted on admission for further management and admission.   [facility] History obtained while on unit for further consideration. Pt has been taking SABA and rescue albuterol inhalers. These medications do not belong to the patient but to his father. Pt does not take the medications as would be directed to him. Pt uses rescue albuterol BID in AM and PM and SABA intermittently. This information was provided by pt wife.  PAST MEDICAL HISTORY :  He  has a past medical history of Allergy and Asthma.  PAST SURGICAL HISTORY: He  has no past surgical history on file.  Allergies  Allergen Reactions  . Peanut-Containing Drug Products Shortness Of Breath and Swelling    No current  facility-administered medications on file prior to encounter.   Current Outpatient Medications on File Prior to Encounter  Medication Sig  . Albuterol Sulfate (PROAIR RESPICLICK) 1497(90 Base) MCG/ACT AEPB Inhale 1 Inhaler into the lungs every 4 (four) hours as needed.  . budesonide-formoterol (SYMBICORT) 80-4.5 MCG/ACT inhaler Inhale 2 puffs into the lungs 2 (two) times daily.  .Marland Kitchenloratadine-pseudoephedrine (CLARITIN-D 24-HOUR) 10-240 MG 24 hr tablet Take 1 tablet by mouth as needed for allergies.     Current Facility-Administered Medications (Endocrine & Metabolic):  .  insulin aspart (novoLOG) injection 0-15 Units .  methylPREDNISolone sodium succinate (SOLU-MEDROL) 125 mg/2 mL injection 60 mg   Current Facility-Administered Medications (Cardiovascular):  .  norepinephrine (LEVOPHED) '4mg'$  in 2567mpremix infusion   Current Facility-Administered Medications (Respiratory):  .  albuterol (PROVENTIL) (2.5 MG/3ML) 0.083% nebulizer solution 2.5 mg .  budesonide (PULMICORT) nebulizer solution 0.5 mg .  ipratropium-albuterol (DUONEB) 0.5-2.5 (3) MG/3ML nebulizer solution 3 mL .  loratadine (CLARITIN) tablet 10 mg .  montelukast (SINGULAIR) tablet 10 mg   Current Facility-Administered Medications (Analgesics):  .  fentaNYL (SUBLIMAZE) bolus via infusion 50 mcg .  fentaNYL 250051min NS 250m49m0mc13m) infusion-PREMIX .  ketamine (KETALAR) 500 mg in sodium chloride 0.9 % 100 mL (5 mg/mL) infusion .  propofol (DIPRIVAN) 1000 MG/100ML infusion   Current Facility-Administered Medications (Hematological):  .  heparin injection 5,000 Units   Current Facility-Administered Medications (Other):  .  0.9 %  sodium chloride infusion .  artificial tears (LACRILUBE) ophthalmic ointment 1 application .  chlorhexidine gluconate (MEDLINE KIT) (PERIDEX) 0.12 % solution 15 mL .  Chlorhexidine Gluconate Cloth 2 % PADS 6 each .  cisatracurium (NIMBEX) 200 mg in sodium chloride 0.9 % 200 mL (1 mg/mL)  infusion .  docusate (COLACE) 50 MG/5ML liquid 100 mg .  docusate sodium (COLACE) capsule 100 mg .  MEDLINE mouth rinse .  midazolam (VERSED) 50 mg/50 mL (1 mg/mL) premix infusion .  midazolam (VERSED) bolus via infusion 1-2 mg .  midazolam (VERSED) injection 2 mg .  pantoprazole (PROTONIX) injection 40 mg .  polyethylene glycol (MIRALAX / GLYCOLAX) packet 17 g .  polyethylene glycol (MIRALAX / GLYCOLAX) packet 17 g .  sodium chloride flush (NS) 0.9 % injection 10-40 mL .  sodium chloride flush (NS) 0.9 % injection 10-40 mL  No current outpatient medications on file.  FAMILY HISTORY:  His He indicated that his mother is alive. He indicated that his father is alive. He indicated that all of his four sisters are alive. He indicated that his brother is alive. He indicated that both of his daughters are alive. He indicated that all of his three sons are alive.   SOCIAL HISTORY: He  reports that he has never smoked. He has never used smokeless tobacco. He reports that he does not drink alcohol or use drugs.  REVIEW OF SYSTEMS:   Full Review of Systems negative except otherwise specified as above   VITAL SIGNS: BP (!) 117/54   Pulse (!) 125   Temp 98.6 F (37 C)   Resp 20   Ht 6' 2"  (1.88 m)   Wt 86.8 kg   SpO2 99%   BMI 24.57 kg/m   HEMODYNAMICS:    VENTILATOR SETTINGS: Vent Mode: Other (Comment) FiO2 (%):  [40 %-100 %] 50 % Set Rate:  [18 bmp-24 bmp] 20 bmp Vt Set:  [380 mL-500 mL] 500 mL PEEP:  [0 cmH20-5 cmH20] 0 cmH20 Plateau Pressure:  [16 cmH20-32 cmH20] 22 cmH20  INTAKE / OUTPUT: I/O last 3 completed shifts: In: 2303.9 [I.V.:2253.9; IV Piggyback:50] Out: 1885 [ZOXWR:6045]  PHYSICAL EXAMINATION: Physical Exam Constitutional:      Appearance: He is ill-appearing.  HENT:     Head: Atraumatic.     Mouth/Throat:     Mouth: Mucous membranes are moist.  Cardiovascular:     Rate and Rhythm: Normal rate and regular rhythm.     Pulses: Normal pulses.     Heart  sounds: No murmur. No friction rub. No gallop.   Pulmonary:     Breath sounds: Wheezing present.     Comments: Pt ventilated Abdominal:     General: Bowel sounds are normal. There is no distension.     Tenderness: There is no abdominal tenderness. There is no guarding.  Musculoskeletal:        General: No swelling or signs of injury.  Skin:    General: Skin is warm.     Capillary Refill: Capillary refill takes 2 to 3 seconds.  Neurological:     Comments: Pt unresponsive       LABS:  BMET Recent Labs  Lab 01/07/20 1227 01/07/20 1233 01/07/20 1425 01/07/20 1636 01/07/20 2257 01/08/20 0330 01/08/20 0338  NA 137   < > 139   < > 142 138 137  K 3.8   < > 4.7   < > 4.2 5.7* 5.6*  CL 103  --  105  --   --  107  --   CO2 23  --  28  --   --  24  --   BUN 15  --  17  --   --  14  --   CREATININE 1.41*  --  1.27*  --   --  1.35*  --   GLUCOSE 245*  --  212*  --   --  152*  --    < > = values in this interval not displayed.    Electrolytes Recent Labs  Lab 01/07/20 1227 01/07/20 1425 01/08/20 0330  CALCIUM 8.9 8.6* 7.9*    CBC Recent Labs  Lab 01/07/20 1227 01/07/20 1233 01/07/20 2257 01/08/20 0330 01/08/20 0338  WBC 6.2  --   --  12.4*  --   HGB 13.3   < > 10.9* 12.7* 13.3  HCT 42.1   < > 32.0* 41.2 39.0  PLT 309  --   --  272  --    < > = values in this interval not displayed.    Coag's Recent Labs  Lab 01/07/20 1425  APTT 31  INR 1.1    Sepsis Markers No results for input(s): LATICACIDVEN, PROCALCITON, O2SATVEN in the last 168 hours.  ABG Recent Labs  Lab 01/07/20 1744 01/07/20 2257 01/08/20 0338  PHART 7.144* 7.136* 7.169*  PCO2ART 82.3* 65.0* 77.3*  PO2ART 201* 127* 230*    Liver Enzymes Recent Labs  Lab 01/07/20 1425  AST 24  ALT 17  ALKPHOS 42  BILITOT 0.9  ALBUMIN 3.9    Cardiac Enzymes No results for input(s): TROPONINI, PROBNP in the last 168 hours.  Glucose Recent Labs  Lab 01/07/20 1515 01/07/20 1911  01/08/20 0032 01/08/20 0336 01/08/20 0754  GLUCAP 174* 129* 133* 137* 168*    Imaging DG CHEST PORT 1 VIEW  Result Date: 01/08/2020 CLINICAL DATA:  Respiratory failure. EXAM: PORTABLE CHEST 1 VIEW COMPARISON:  Chest x-ray from yesterday. FINDINGS: Unchanged endotracheal tube. New enteric tube in the stomach. New left upper extremity PICC line with tip near the cavoatrial junction. The heart size and mediastinal contours are within normal limits. Normal pulmonary vascularity. No focal consolidation, pleural effusion, or pneumothorax. No acute osseous abnormality. IMPRESSION: 1. No active disease.  Lines and tubes as above. Electronically Signed   By: Titus Dubin M.D.   On: 01/08/2020 08:26   DG Chest Portable 1 View  Result Date: 01/07/2020 CLINICAL DATA:  Intubation EXAM: PORTABLE CHEST 1 VIEW COMPARISON:  None. FINDINGS: Endotracheal tube is approximately 6 cm above the carina. Lungs are clear. No pleural effusion or pneumothorax. Cardiomediastinal contours are within normal limits with normal heart size. IMPRESSION: Endotracheal tube in place.  Clear lungs. Electronically Signed   By: Macy Mis M.D.   On: 01/07/2020 12:39   ECHOCARDIOGRAM COMPLETE  Result Date: 01/07/2020    ECHOCARDIOGRAM REPORT   Patient Name:   Isaac Estrada Date of Exam: 01/07/2020 Medical Rec #:  [id number]    Height:       73.5 in Accession #:    [id number]   Weight:       191.8 lb Date of Birth:  [birth year]   BSA:          2.124 m Patient Age:    68 years     BP:           137/73 mmHg Patient Gender: M            HR:           101 bpm. Exam Location:  Inpatient Procedure: 2D Echo STAT ECHO Indications:  pre ECMO evaluation  History:       Patient has no prior history of Echocardiogram examinations.                Asthma.  Sonographer:   Johny Chess Referring      Isaac Estrada  1. Left ventricular ejection fraction, by estimation, is 60 to 65%. The left ventricle has normal function.  The left ventricle has no regional wall motion abnormalities. Left ventricular diastolic parameters were normal.  2. Right ventricular systolic function is normal. The right ventricular size is normal.  3. The mitral valve is normal in structure. Trivial mitral valve regurgitation. No evidence of mitral stenosis.  4. The aortic valve is normal in structure. Aortic valve regurgitation is not visualized. No aortic stenosis is present.  5. The inferior vena cava is normal in size with greater than 50% respiratory variability, suggesting right atrial pressure of 3 mmHg. FINDINGS  Left Ventricle: Left ventricular ejection fraction, by estimation, is 60 to 65%. The left ventricle has normal function. The left ventricle has no regional wall motion abnormalities. The left ventricular internal cavity size was normal in size. There is  no left ventricular hypertrophy. Left ventricular diastolic parameters were normal. Normal left ventricular filling pressure. Right Ventricle: The right ventricular size is normal. No increase in right ventricular wall thickness. Right ventricular systolic function is normal. Left Atrium: Left atrial size was normal in size. Right Atrium: Right atrial size was normal in size. Pericardium: There is no evidence of pericardial effusion. Mitral Valve: The mitral valve is normal in structure. Normal mobility of the mitral valve leaflets. Trivial mitral valve regurgitation. No evidence of mitral valve stenosis. Tricuspid Valve: The tricuspid valve is normal in structure. Tricuspid valve regurgitation is trivial. No evidence of tricuspid stenosis. Aortic Valve: The aortic valve is normal in structure. Aortic valve regurgitation is not visualized. No aortic stenosis is present. Pulmonic Valve: The pulmonic valve was normal in structure. Pulmonic valve regurgitation is not visualized. No evidence of pulmonic stenosis. Aorta: The aortic root is normal in size and structure. Venous: The inferior vena cava  is normal in size with greater than 50% respiratory variability, suggesting right atrial pressure of 3 mmHg. IAS/Shunts: No atrial level shunt detected by color flow Doppler.  LEFT VENTRICLE PLAX 2D LVIDd:         4.60 cm  Diastology LVIDs:         3.30 cm  LV e' lateral:   15.80 cm/s LV PW:         0.90 cm  LV E/e' lateral: 6.0 LV IVS:        0.80 cm  LV e' medial:    17.30 cm/s LVOT diam:     1.70 cm  LV E/e' medial:  5.5 LV SV:         48 LV SV Index:   22 LVOT Area:     2.27 cm  RIGHT VENTRICLE RV S prime:     17.60 cm/s TAPSE (M-mode): 2.8 cm LEFT ATRIUM             Index       RIGHT ATRIUM          Index LA diam:        3.20 cm 1.51 cm/m  RA Area:     8.73 cm LA Vol (A2C):   32.5 ml 15.30 ml/m RA Volume:   15.90 ml 7.48 ml/m LA Vol (A4C):  21.2 ml 9.98 ml/m LA Biplane Vol: 27.4 ml 12.90 ml/m  AORTIC VALVE LVOT Vmax:   114.00 cm/s LVOT Vmean:  72.200 cm/s LVOT VTI:    0.210 m MITRAL VALVE MV Area (PHT): 3.48 cm    SHUNTS MV Decel Time: 218 msec    Systemic VTI:  0.21 m MV E velocity: 95.50 cm/s  Systemic Diam: 1.70 cm MV A velocity: 67.70 cm/s MV E/A ratio:  1.41 Ena Dawley MD Electronically signed by Ena Dawley MD Signature Date/Time: 01/07/2020/3:33:15 PM    Final    [place] EKG SITE RITE  Result Date: 01/07/2020 If Site Rite image not attached, placement could not be confirmed due to current cardiac rhythm.    ANTIBIOTICS: Anti-infectives (From admission, onward)   None       SIGNIFICANT EVENTS: 01/07/20- Pt transported by EMS to ED intubated and non responsive  LINES/TUBES: Drain NG/OG Tube Orogastric 14 Fr. less than 1 day  Urethral Catheter Isaac Estrada, B RN 16 Fr. less than 1 day   Airway Airway 8 mm -- days   PICC Line PICC Triple Lumen 01/07/20 PICC Left Brachial 49 cm 0 cm less than 1 day   PIV Line Peripheral IV 01/07/20 Left Antecubital less than 1 day  Peripheral IV 01/07/20 Left Hand less than 1 day  Peripheral IV 01/07/20 Right Hand less than 1 day   ART  Line Arterial Line 01/07/20 Right Radial less than 1 day     DISCUSSION: The patient is critically ill with multiple organ systems failure and requires high complexity decision making for assessment and support, frequent evaluation and titration of therapies, application of advanced monitoring technologies and extensive interpretation of multiple databases.  Pt status has not changed overnight, no acute events noted, serial ABGs showing slight improvement but remains acidotic.  ASSESSMENT / PLAN: Garland Hincapie is a 39yo male with past medical history of asthma but no other medical history who presented to ED intubated in the field due to a severe asthma attack. Patient asthma is self treated without PCP supervision.  On examination the pt is unresponsive and ventilated. Wheezing is significant but improved from yesterday in ED. While in ED primary ABG showed significant acidemia.Peak flow measurement demonstrated pt likely had air trapping resulting in hypercapnia and respiratory acidosis. ECMO consult called to ED apprec recs.  Serial ABG has shown mild improvement, and increased art 02, along with PhysEx showing some improvement in air movement is hopeful signs.   ECG shows sinus tachycardia. Follow up CXR shows proper tube placement, no other pathologic findings noted.  PULMONARY Status Asthmaticus  Acute Resp Acidosis Respiratory failure- Acute hypoxic/hypercarbic Pt history is very suggestive of under/untreated asthma in an otherwise healthy patient. Patient is currently sedated. ABGs are trending in desired direction, pt remains hypercarbic, but resolution of acid bas deficit noted. MV settings to be adjusted based on phys exam and considerations of functionality during visits. Will continue admission plan -  albuterol (2.5 MG/3ML) 0.083% nebulizer solution 2.5 mg -  budesonide  nebulizer solution 0.5 mg -  ipratropium-albuterol0.5-2.5 (3) MG/3ML nebulizer solution 3 mL -  loratadine  tablet 10 mg -  montelukast  tablet 10 mg  Hyperkalemia Likely secondary to acidosis, ECG shows no abnormal changes. Pt was given bicarb injections, will have continuous cardiac monitoring. Pt has been afebrile and is COVID neg - Test Mg and Phosporus - Micro labs - Trend BMP (ISTAT7) - Trend CBC - Cont NS infusion    FAMILY  - Updates:   -  Inter-disciplinary family meet or Palliative Care meeting due by: Occurred day [facility]  01/08/2020, 9:27 AM

## 2020-01-08 NOTE — Progress Notes (Signed)
eLink Physician-Brief Progress Note Patient Name: Isaac Estrada DOB: 10/10/1980 MRN: [id number]   Date of Service  01/08/2020  HPI/Events of Note  Hyperkalemia - K+ = 5.7. QRs not widened or T wave peaked on monitor.  eICU Interventions  Will order: 1. D/C LR IV infuson. 2. 0.9 NaCl IV infusion to run at 75 mL/hour.  3. NaHCO3 100 meq IV now.  4. Lokelma 10 gm per tube now.  5. Repeat BMP at 12 noon.     Intervention Category Major Interventions: Electrolyte abnormality - evaluation and management  Rhonda Linan Eugene 01/08/2020, 5:38 AM

## 2020-01-08 NOTE — Progress Notes (Addendum)
No racemic epi treatments per Dr. Katrinka Blazing. Patient's systolic BP exceeded 220 after administration. Verbal order given for hydralazine push and propofol titration.

## 2020-01-08 NOTE — Progress Notes (Signed)
Initial Nutrition Assessment  DOCUMENTATION CODES:   Not applicable  INTERVENTION:   Tube feeding:  -Vital AF 1.2 @ 40 ml/hr via OGT -30 ml Prostat QID  Provides: 1552 kcals (2172 kcal with propofol), 132 grams protein, 779 ml free water.   NUTRITION DIAGNOSIS:   Increased nutrient needs related to acute illness as evidenced by estimated needs.  GOAL:   Patient will meet greater than or equal to 90% of their needs  MONITOR:   Labs, Vent status, Weight trends, TF tolerance, Skin, I & O's  REASON FOR ASSESSMENT:   Ventilator, Consult Enteral/tube feeding initiation and management  ASSESSMENT:   Patient with PMH significant for asthma. Presents this admission with severe asthma attack.   RD working remotely.  ECMO team initially consulted. Bronchospasm improving. On paralytic. High dose propofol. Hyperkalemia improved. Okay to feed per CCM.   Patient is currently intubated on ventilator support MV: 7.8 L/min Temp (24hrs), Avg:98.3 F (36.8 C), Min:95.1 F (35.1 C), Max:99.7 F (37.6 C)  Propofol: 23.5 ml/hr- provides 620 ml from lipids daily   I/O: +1,377 ml since admit  UOP: 1,885 ml x 24 hrs   Drips: NS @ 75 ml/hr nimbex, levophed, propofol Medications: colace, SS novolog, solumedrol, miralax Labs: K 3.4 (L) CBG [phone]  Diet Order:   Diet Order            Diet NPO time specified  Diet effective now              EDUCATION NEEDS:   Not appropriate for education at this time  Skin:  Skin Assessment: Reviewed RN Assessment  Last BM:  PTA  Height:   Ht Readings from Last 1 Encounters:  01/07/20 6\' 2"  (1.88 m)    Weight:   Wt Readings from Last 1 Encounters:  01/08/20 86.8 kg    BMI:  Body mass index is 24.57 kg/m.  Estimated Nutritional Needs:   Kcal:  1969 kcal  Protein:  130-150 grams  Fluid:  >/= 1.9 L/day   01/10/20 RD, LDN Clinical Nutrition Pager listed in AMION

## 2020-01-08 NOTE — Progress Notes (Signed)
Pt with only 120 CC UOP for prev shift. Pt has distended abdomen, so bladder scan was done and showed amount greater than 999. This nurse attempted to irrigate foley but was unsuccessful. E-link called and gave the ok to replace pt's foley. Will continue to monitor pt closely this shift.

## 2020-01-08 NOTE — Progress Notes (Addendum)
CRITICAL VALUE ALERT  Critical Value: Lactic Acid 2.8  Date & Time Notied: 01/08/20 1315  Provider Notified: Myrla Halsted MD    Orders Received/Actions taken: will continue to monitor

## 2020-01-08 NOTE — Progress Notes (Unsigned)
     Date: Jan 08, 2020   To whom it may concern:  Please excuse Isaac Estrada from any legal responsibilities for at least May 12 through May 24 for a severe medical condition requiring ICU admission.  Please reach out if any questions or concerns.   Sincerely,     Levon Hedger MD  [facility] Pulmonary Critical Care [phone]

## 2020-01-08 NOTE — Progress Notes (Addendum)
NAME:  Isaac Estrada, MRN:  [id number], DOB:  [birth year], LOS: 1 ADMISSION DATE:  01/07/2020, CONSULTATION DATE:  01/07/2020 REFERRING MD:  Dr. Maryan Rued, CHIEF COMPLAINT:  Severe asthma attack   Brief History   39yo male presented to ED after being intubated in the field after suffering a severe asthma attack. PCCM consulted for further assistance and admission  History of present illness   Isaac Estrada is a 39yo male with past medical history of asthma but no other medical history who presented to ED intubated in the field due to a severe asthma attack. History obtain from patients spouse as patient is currently sedated and on ventilator. Per wife patient had preformed yard work yesterday per his usual and made a trip to [employer] to obtain yard care supplies and after retuning home he began experiencing shortness of breath. Patient then called his mom stating he was having progressive shortness of breath due to an asthma attack and on her arrival patient was in severe respiratory distress therefore she called 31. 2-92mins prior to their arrival patient became apneic with oxygen saturations 50% on EMS arrival, he was intubated and transported to the ED.   On arrival patient was seen tachypneic and tachycardia. ABG on arrival 7.168 / 67.7 / 206 / 24.5. Full lab work pending but CBC and partial bmet within normal limits. PCCM consulted on admission for further management and admission  Past Medical History  [facility] Events   01/07/2020 Admitted and intubated in field   Consults:  ECMO   Procedures:    Significant Diagnostic Tests:  CXR 5/12 > Endotracheal tube in place.  Clear lungs.  Micro Data:  COVID 5/12 > RVP 5/12 >  Antimicrobials:     Interim history/subjective:  No events Bronchospasm improving  Objective   Blood pressure (!) 117/54, pulse (!) 125, temperature 98.6 F (37 C), resp. rate 20, height 6\' 2"  (1.88 m), weight 86.8 kg, SpO2 99 %.      Vent Mode: Other (Comment) FiO2 (%):  [40 %-100 %] 50 % Set Rate:  [18 bmp-24 bmp] 20 bmp Vt Set:  [380 mL-500 mL] 500 mL PEEP:  [0 cmH20-5 cmH20] 0 cmH20 Plateau Pressure:  [16 cmH20-32 cmH20] 22 cmH20   Intake/Output Summary (Last 24 hours) at 01/08/2020 0901 Last data filed at 01/08/2020 0800 Gross per 24 hour  Intake 2472.86 ml  Output 1935 ml  Net 537.86 ml   Filed Weights   01/07/20 1223 01/08/20 0400  Weight: 87 kg 86.8 kg    Examination: GEN: young man on vent HEENT: ETT no secretions CV: tachycardic, regular ext warm PULM: bilateral wheezing, better air movement than yesterday GI: soft, hypoactive BS EXT: No edema NEURO: Paralyzed PSYCH: N/A SKIN: No rashes   Resolved Hospital Problem list     Assessment & Plan:  Status asthmaticus Acute hypoxic and hypercarbic respiratory failure Acute respiratory acidosis   -Sedation as ordered titrated to BIS ~60 -Continue paralytics for today -Steroids, nebs -Add pulmicort, singulair, claritin -Gradually increase MV as tolerated by lung mechanics, I will be going into room and adjusting periodically   Best practice:  Diet: NPO for now Pain/Anxiety/Delirium protocol (if indicated): Fentanyl, ketamine, propofol, versed VAP protocol (if indicated): In place DVT prophylaxis: Lovenox GI prophylaxis: PPI Glucose control: Monitor  Mobility: Bedrest Code Status: Full Family Communication: Updated at bedside  Disposition: ICU   The patient is critically ill with multiple organ systems failure and requires high complexity decision making  for assessment and support, frequent evaluation and titration of therapies, application of advanced monitoring technologies and extensive interpretation of multiple databases. Critical Care Time devoted to patient care services described in this note independent of APP/resident time (if applicable)  is 43 minutes.   Myrla Halsted MD [facility] 01/08/2020 9:05  AM Personal pager: [phone] If unanswered, please page CCM On-call: #[phone]

## 2020-01-08 NOTE — Progress Notes (Signed)
eLink Physician-Brief Progress Note Patient Name: Isaac Estrada DOB: 1981-01-19 MRN: [id number]   Date of Service  01/08/2020  HPI/Events of Note  ABG on 60%/VC 18/TV 430/P 0 = 7.169/77.3/230/30. Ppeak = 38. Not sure that he will tolerate increasing the VC rate at this time. Continue permissive hypercapnia   eICU Interventions  Continue present ventilator management.      Intervention Category Major Interventions: Respiratory failure - evaluation and management;Acid-Base disturbance - evaluation and management  Tekila Caillouet Eugene 01/08/2020, 4:42 AM

## 2020-01-09 LAB — POCT I-STAT 7, (LYTES, BLD GAS, ICA,H+H)
Acid-Base Excess: 5 mmol/L — ABNORMAL HIGH (ref 0.0–2.0)
Acid-Base Excess: 8 mmol/L — ABNORMAL HIGH (ref 0.0–2.0)
Bicarbonate: 32.3 mmol/L — ABNORMAL HIGH (ref 20.0–28.0)
Bicarbonate: 33 mmol/L — ABNORMAL HIGH (ref 20.0–28.0)
Calcium, Ion: 1.24 mmol/L (ref 1.15–1.40)
Calcium, Ion: 1.3 mmol/L (ref 1.15–1.40)
HCT: 30 % — ABNORMAL LOW (ref 39.0–52.0)
HCT: 32 % — ABNORMAL LOW (ref 39.0–52.0)
Hemoglobin: 10.2 g/dL — ABNORMAL LOW (ref 13.0–17.0)
Hemoglobin: 10.9 g/dL — ABNORMAL LOW (ref 13.0–17.0)
O2 Saturation: 89 %
O2 Saturation: 97 %
Patient temperature: 37
Potassium: 3.7 mmol/L (ref 3.5–5.1)
Potassium: 4.7 mmol/L (ref 3.5–5.1)
Sodium: 144 mmol/L (ref 135–145)
Sodium: 147 mmol/L — ABNORMAL HIGH (ref 135–145)
TCO2: 34 mmol/L — ABNORMAL HIGH (ref 22–32)
TCO2: 35 mmol/L — ABNORMAL HIGH (ref 22–32)
pCO2 arterial: 42 mmHg (ref 32.0–48.0)
pCO2 arterial: 65.8 mmHg (ref 32.0–48.0)
pH, Arterial: 7.308 — ABNORMAL LOW (ref 7.350–7.450)
pH, Arterial: 7.494 — ABNORMAL HIGH (ref 7.350–7.450)
pO2, Arterial: 104 mmHg (ref 83.0–108.0)
pO2, Arterial: 53 mmHg — ABNORMAL LOW (ref 83.0–108.0)

## 2020-01-09 LAB — GLUCOSE, CAPILLARY
Glucose-Capillary: 136 mg/dL — ABNORMAL HIGH (ref 70–99)
Glucose-Capillary: 123 mg/dL — ABNORMAL HIGH (ref 70–99)
Glucose-Capillary: 130 mg/dL — ABNORMAL HIGH (ref 70–99)
Glucose-Capillary: 130 mg/dL — ABNORMAL HIGH (ref 70–99)
Glucose-Capillary: 87 mg/dL (ref 70–99)
Glucose-Capillary: 107 mg/dL — ABNORMAL HIGH (ref 70–99)

## 2020-01-09 LAB — MAGNESIUM: Magnesium: 2.1 mg/dL (ref 1.7–2.4)

## 2020-01-09 LAB — BASIC METABOLIC PANEL
Anion gap: 5 (ref 5–15)
BUN: 15 mg/dL (ref 6–20)
CO2: 29 mmol/L (ref 22–32)
Calcium: 8.2 mg/dL — ABNORMAL LOW (ref 8.9–10.3)
Chloride: 110 mmol/L (ref 98–111)
Creatinine, Ser: 1.14 mg/dL (ref 0.61–1.24)
GFR calc Af Amer: >60 mL/min (ref >60)
GFR calc non Af Amer: >60 mL/min (ref >60)
Glucose, Bld: 136 mg/dL — ABNORMAL HIGH (ref 70–99)
Potassium: 4.6 mmol/L (ref 3.5–5.1)
Sodium: 144 mmol/L (ref 135–145)

## 2020-01-09 LAB — LACTIC ACID, PLASMA: Lactic Acid, Venous: 1.5 mmol/L (ref 0.5–1.9)

## 2020-01-09 LAB — PHOSPHORUS: Phosphorus: 2 mg/dL — ABNORMAL LOW (ref 2.5–4.6)

## 2020-01-09 LAB — TRIGLYCERIDES: Triglycerides: 129 mg/dL (ref <150)

## 2020-01-09 MED ORDER — HALOPERIDOL LACTATE 5 MG/ML IJ SOLN: 2.0000 mg | Freq: Once | INTRAMUSCULAR | Status: AC

## 2020-01-09 MED ORDER — HALOPERIDOL LACTATE 5 MG/ML IJ SOLN
INTRAMUSCULAR | Status: AC
  Administered 2020-01-09: 2 mg via INTRAVENOUS
  Filled 2020-01-09: qty 1

## 2020-01-09 MED ORDER — DEXMEDETOMIDINE HCL IN NACL 400 MCG/100ML IV SOLN
0.4000 ug/kg/h | INTRAVENOUS | Status: DC
  Administered 2020-01-09: 0.4 ug/kg/h via INTRAVENOUS
  Administered 2020-01-10: 1.7 ug/kg/h via INTRAVENOUS
  Administered 2020-01-10: 1.1 ug/kg/h via INTRAVENOUS
  Administered 2020-01-10: 1.6 ug/kg/h via INTRAVENOUS
  Administered 2020-01-10: 1.4 ug/kg/h via INTRAVENOUS
  Administered 2020-01-10: 0.9 ug/kg/h via INTRAVENOUS
  Administered 2020-01-10: 1.7 ug/kg/h via INTRAVENOUS
  Administered 2020-01-11: 1.1 ug/kg/h via INTRAVENOUS
  Filled 2020-01-09 (×9): qty 100

## 2020-01-09 NOTE — Progress Notes (Addendum)
NAME:  Isaac Estrada, MRN:  [id number], DOB:  09/06/1980, LOS: 2 ADMISSION DATE:  01/07/2020, CONSULTATION DATE:  01/07/2020 REFERRING MD:  Dr. Anitra Lauth, CHIEF COMPLAINT:  Severe asthma attack   Brief History   38yo male presented to ED after being intubated in the field after suffering a severe asthma attack. PCCM consulted for further assistance and admission  History of present illness   Isaac Estrada is a 39yo male with past medical history of asthma but no other medical history who presented to ED intubated in the field due to a severe asthma attack. History obtain from patients spouse as patient is currently sedated and on ventilator. Per wife patient had preformed yard work yesterday per his usual and made a trip to [employer] to obtain yard care supplies and after retuning home he began experiencing shortness of breath. Patient then called his mom stating he was having progressive shortness of breath due to an asthma attack and on her arrival patient was in severe respiratory distress therefore she called 62. 2-26mins prior to their arrival patient became apneic with oxygen saturations 50% on EMS arrival, he was intubated and transported to the ED.   On arrival patient was seen tachypneic and tachycardia. ABG on arrival 7.168 / 67.7 / 206 / 24.5. Full lab work pending but CBC and partial bmet within normal limits. PCCM consulted on admission for further management and admission  Past Medical History  Asthma   Significant Hospital Events   01/07/2020 Admitted and intubated in field   Consults:  ECMO   Procedures:    Significant Diagnostic Tests:  CXR 5/12 > Endotracheal tube in place.  Clear lungs.  Micro Data:  COVID 5/12 > RVP 5/12 >  Antimicrobials:     Interim history/subjective:  Continues to improve. Still sedated and paralyzed on vent. Had urinary retention issues so foley re-inserted.  Objective   Blood pressure (!) 143/65, pulse 100, temperature (!) 97 F  (36.1 C), resp. rate 16, height 6\' 2"  (1.88 m), weight 89.3 kg, SpO2 99 %.    Vent Mode: Other (Comment) FiO2 (%):  [40 %-50 %] 40 % Set Rate:  [16 bmp] 16 bmp Vt Set:  [410 mL-470 mL] 410 mL PEEP:  [0 cmH20] 0 cmH20 Plateau Pressure:  [15 cmH20-28 cmH20] 16 cmH20   Intake/Output Summary (Last 24 hours) at 01/09/2020 1114 Last data filed at 01/09/2020 0941 Gross per 24 hour  Intake 4188.46 ml  Output 2485 ml  Net 1703.46 ml   Filed Weights   01/07/20 1223 01/08/20 0400 01/09/20 0500  Weight: 87 kg 86.8 kg 89.3 kg    Examination: GEN: young man on vent HEENT: ETT no secretions CV: tachycardic, regular ext warm PULM: bilateral wheezing, better air movement than yesterday GI: soft, hypoactive BS EXT: No edema NEURO: Paralyzed PSYCH: N/A SKIN: No rashes  Labs, sugars all look god  Resolved Hospital Problem list     Assessment & Plan:  Status asthmaticus Acute hypoxic and hypercarbic respiratory failure Acute respiratory acidosis  -Everything looking better. -Wean paralytic and sedation. -Continue IV steroids and nebs for now. -Will continue to adjust vent throughout day to minimize air trapping while keeping ventilation reasonable. -Family updated at length  Urinary retention- continue foley, retrial once sedation wears off a bit  Best practice:  Diet: Will start TF if not ready later today for extubation Pain/Anxiety/Delirium protocol (if indicated): Fentanyl, ketamine, propofol, versed VAP protocol (if indicated): In place DVT prophylaxis: Lovenox GI prophylaxis: PPI Glucose  control: Monitor  Mobility: Bedrest Code Status: Full Family Communication: Updated at bedside  Disposition: ICU   The patient is critically ill with multiple organ systems failure and requires high complexity decision making for assessment and support, frequent evaluation and titration of therapies, application of advanced monitoring technologies and extensive interpretation of multiple  databases. Critical Care Time devoted to patient care services described in this note independent of APP/resident time (if applicable)  is 33 minutes.   Erskine Emery MD [facility] 01/09/2020 11:14 AM Personal pager: [phone] If unanswered, please page CCM On-call: [phone]

## 2020-01-09 NOTE — Progress Notes (Signed)
eLink Physician-Brief Progress Note Patient Name: Isaac Estrada DOB: [birth year] MRN: [id number]   Date of Service  01/09/2020  HPI/Events of Note  Delirium - Currently on a Precedex IV infusion. QTc = 0.372 seconds.   eICU Interventions  Will order: 1. Haldol 2 mg IV now. 2. Will ask ground team to assess the patient at bedside.      Intervention Category Major Interventions: Delirium, psychosis, severe agitation - evaluation and management  Parish Augustine Eugene 01/09/2020, 11:55 PM

## 2020-01-09 NOTE — Procedures (Signed)
Extubation Procedure Note  Patient Details:   Name: Isaac Estrada DOB: Sep 28, 1980 MRN: [id number]   Airway Documentation:    Vent end date: 01/09/20 Vent end time: 1745   Evaluation  O2 sats: stable throughout Complications: No apparent complications Patient did tolerate procedure well. Bilateral Breath Sounds: Diminished   Pt extubated by Dr Katrinka Blazing to 4L [place]. Cuff leak was positive. No stridor noted.  Guss Bunde 01/09/2020, 5:46 PM

## 2020-01-09 NOTE — Progress Notes (Signed)
PULMONARY / CRITICAL CARE MEDICINE   Name: Isaac Estrada MRN: [id number] DOB: Oct 06, 1980    ADMISSION DATE:  01/07/2020 CONSULTATION DATE: 01/07/20 LOS 2   REFERRING MD:  Dr. Maryan Rued CHIEF COMPLAINT:  Severe asthma attack  HISTORY OF PRESENT ILLNESS:   Isaac Estrada is a 39yo male with past medical history of asthma but no other medical history who presented to ED intubated in the field due to a severe asthma attack. History obtain from patients spouse as patient is currently sedated and on ventilator. Per wife patient had preformed yard work yesterday per his usual and made a trip to [employer] to obtain yard care supplies and after retuning home he began experiencing shortness of breath. Patient then called his mom stating he was having progressive shortness of breath due to an asthma attack and on her arrival patient was in severe respiratory distress therefore she called 17. 2-85mns prior to their arrival patient became apneic with oxygen saturations 50% on EMS arrival, he was intubated and transported to the ED.   On arrival patient was seen tachypneic and tachycardia. ABG on arrival 7.168 / 67.7 / 206 / 24.5. Full lab work pending but CBC and partial bmet within normal limits. PCCM consulted on admission for further management and admission.   [facility] History obtained while on unit for further consideration. Pt has been taking SABA and rescue albuterol inhalers. These medications do not belong to the patient but to his father. Pt does not take the medications as would be directed to him. Pt uses rescue albuterol BID in AM and PM and SABA intermittently. This information was provided by pt wife.  PAST MEDICAL HISTORY :  He  has a past medical history of Allergy and Asthma.  PAST SURGICAL HISTORY: He  has no past surgical history on file.  Allergies  Allergen Reactions  . Peanut-Containing Drug Products Shortness Of Breath and Swelling    No current  facility-administered medications on file prior to encounter.   Current Outpatient Medications on File Prior to Encounter  Medication Sig  . Albuterol Sulfate (PROAIR RESPICLICK) 1664(90 Base) MCG/ACT AEPB Inhale 1 Inhaler into the lungs every 4 (four) hours as needed.  . budesonide-formoterol (SYMBICORT) 80-4.5 MCG/ACT inhaler Inhale 2 puffs into the lungs 2 (two) times daily.  .Marland Kitchenloratadine-pseudoephedrine (CLARITIN-D 24-HOUR) 10-240 MG 24 hr tablet Take 1 tablet by mouth as needed for allergies.     Current Facility-Administered Medications (Endocrine & Metabolic):  .  insulin aspart (novoLOG) injection 0-15 Units .  methylPREDNISolone sodium succinate (SOLU-MEDROL) 125 mg/2 mL injection 60 mg   Current Facility-Administered Medications (Cardiovascular):  .  hydrALAZINE (APRESOLINE) injection 2 mg .  nicardipine (CARDENE) 240min 0.86% saline 20055mV infusion (0.1 mg/ml) .  norepinephrine (LEVOPHED) 4mg62m 250mL38mmix infusion   Current Facility-Administered Medications (Respiratory):  .  albuterol (PROVENTIL) (2.5 MG/3ML) 0.083% nebulizer solution 2.5 mg .  budesonide (PULMICORT) nebulizer solution 0.5 mg .  ipratropium-albuterol (DUONEB) 0.5-2.5 (3) MG/3ML nebulizer solution 3 mL .  loratadine (CLARITIN) tablet 10 mg .  montelukast (SINGULAIR) tablet 10 mg   Current Facility-Administered Medications (Analgesics):  .  fentaNYL (SUBLIMAZE) bolus via infusion 50 mcg .  fentaNYL 2500mcg48mNS 250mL (67mg/m72mnfusion-PREMIX .  ketamine (KETALAR) 500 mg in sodium chloride 0.9 % 100 mL (5 mg/mL) infusion .  propofol (DIPRIVAN) 1000 MG/100ML infusion   Current Facility-Administered Medications (Hematological):  .  heparin injection 5,000 Units   Current Facility-Administered  Medications (Other):  .  0.9 %  sodium chloride infusion .  artificial tears (LACRILUBE) ophthalmic ointment 1 application .  chlorhexidine gluconate (MEDLINE KIT) (PERIDEX) 0.12 % solution 15 mL .   Chlorhexidine Gluconate Cloth 2 % PADS 6 each .  cisatracurium (NIMBEX) 200 mg in sodium chloride 0.9 % 200 mL (1 mg/mL) infusion .  docusate (COLACE) 50 MG/5ML liquid 100 mg .  docusate sodium (COLACE) capsule 100 mg .  feeding supplement (PRO-STAT SUGAR FREE 64) liquid 30 mL .  feeding supplement (VITAL AF 1.2 CAL) liquid 1,000 mL .  MEDLINE mouth rinse .  midazolam (VERSED) 50 mg/50 mL (1 mg/mL) premix infusion .  midazolam (VERSED) bolus via infusion 1-2 mg .  midazolam (VERSED) injection 2 mg .  midazolam (VERSED) injection 2 mg .  midazolam (VERSED) injection 2 mg .  pantoprazole (PROTONIX) injection 40 mg .  polyethylene glycol (MIRALAX / GLYCOLAX) packet 17 g .  polyethylene glycol (MIRALAX / GLYCOLAX) packet 17 g .  sodium chloride flush (NS) 0.9 % injection 10-40 mL .  sodium chloride flush (NS) 0.9 % injection 10-40 mL  No current outpatient medications on file.  FAMILY HISTORY:  His He indicated that his mother is alive. He indicated that his father is alive. He indicated that all of his four sisters are alive. He indicated that his brother is alive. He indicated that both of his daughters are alive. He indicated that all of his three sons are alive.   SOCIAL HISTORY: He  reports that he has never smoked. He has never used smokeless tobacco. He reports that he does not drink alcohol or use drugs.  REVIEW OF SYSTEMS:   Full Review of Systems negative except otherwise specified as above   VITAL SIGNS: BP (!) 143/65   Pulse 100   Temp (!) 97 F (36.1 C)   Resp 16   Ht 6' 2"  (1.88 m)   Wt 89.3 kg   SpO2 99%   BMI 25.28 kg/m   HEMODYNAMICS:    VENTILATOR SETTINGS: Vent Mode: Other (Comment) FiO2 (%):  [40 %-50 %] 40 % Set Rate:  [16 bmp-18 bmp] 16 bmp Vt Set:  [410 mL-470 mL] 410 mL PEEP:  [0 cmH20] 0 cmH20 Plateau Pressure:  [15 cmH20-28 cmH20] 16 cmH20  INTAKE / OUTPUT: I/O last 3 completed shifts: In: 6864.7 [I.V.:6246; NG/GT:618.7] Out: 4020  [Urine:4020]  PHYSICAL EXAMINATION: Physical Exam Constitutional:      Appearance: He is ill-appearing.  HENT:     Head: Atraumatic.     Mouth/Throat:     Mouth: Mucous membranes are moist.  Cardiovascular:     Rate and Rhythm: Normal rate and regular rhythm.     Pulses: Normal pulses.     Heart sounds: No murmur. No friction rub. No gallop.   Pulmonary:     Breath sounds: Wheezing present.     Comments: Pt ventilated Abdominal:     General: Bowel sounds are normal. There is no distension.     Tenderness: There is no abdominal tenderness. There is no guarding.  Musculoskeletal:        General: No swelling or signs of injury.  Skin:    General: Skin is warm.     Capillary Refill: Capillary refill takes 2 to 3 seconds.  Neurological:     Comments: Pt unresponsive       LABS:  BMET Recent Labs  Lab 01/08/20 0330 01/08/20 0338 01/08/20 1200 01/08/20 1245 01/08/20 1627 01/09/20 0502  01/09/20 0508  NA 138   < > 141   < > 141 144 144  K 5.7*   < > 3.4*   < > 4.8 4.6 4.7  CL 107  --  106  --   --  110  --   CO2 24  --  26  --   --  29  --   BUN 14  --  13  --   --  15  --   CREATININE 1.35*  --  1.19  --   --  1.14  --   GLUCOSE 152*  --  152*  --   --  136*  --    < > = values in this interval not displayed.    Electrolytes Recent Labs  Lab 01/08/20 0330 01/08/20 1200 01/08/20 1507 01/08/20 1824 01/09/20 0502  CALCIUM 7.9* 7.6*  --   --  8.2*  MG  --   --  2.1 2.3 2.1  PHOS  --   --  4.3 3.5 2.0*    CBC Recent Labs  Lab 01/07/20 1227 01/07/20 1233 01/08/20 0330 01/08/20 0338 01/08/20 1245 01/08/20 1627 01/09/20 0508  WBC 6.2  --  12.4*  --   --   --   --   HGB 13.3   < > 12.7*   < > 9.9* 11.2* 10.2*  HCT 42.1   < > 41.2   < > 29.0* 33.0* 30.0*  PLT 309  --  272  --   --   --   --    < > = values in this interval not displayed.    Coag's Recent Labs  Lab 01/07/20 1425  APTT 31  INR 1.1    Sepsis Markers Recent Labs  Lab  01/08/20 1200 01/08/20 2330  LATICACIDVEN 2.8* 1.5    ABG Recent Labs  Lab 01/08/20 1245 01/08/20 1627 01/09/20 0508  PHART 7.389 7.325* 7.308*  PCO2ART 38.8 63.5* 65.8*  PO2ART 205* 223* 104    Liver Enzymes Recent Labs  Lab 01/07/20 1425  AST 24  ALT 17  ALKPHOS 42  BILITOT 0.9  ALBUMIN 3.9    Cardiac Enzymes No results for input(s): TROPONINI, PROBNP in the last 168 hours.  Glucose Recent Labs  Lab 01/08/20 0754 01/08/20 1150 01/08/20 1544 01/08/20 2023 01/08/20 2335 01/09/20 0510  GLUCAP 168* 149* 163* 126* 145* 136*    Imaging No results found.   ANTIBIOTICS: Anti-infectives (From admission, onward)   None       SIGNIFICANT EVENTS: 01/07/20- Pt transported by EMS to ED intubated and non responsive  LINES/TUBES: Drain NG/OG Tube Orogastric 14 Fr. less than 1 day  Urethral Catheter Maggie, B RN 16 Fr. less than 1 day   Airway Airway 8 mm -- days   PICC Line PICC Triple Lumen 01/07/20 PICC Left Brachial 49 cm 0 cm less than 1 day   PIV Line Peripheral IV 01/07/20 Left Antecubital less than 1 day  Peripheral IV 01/07/20 Left Hand less than 1 day  Peripheral IV 01/07/20 Right Hand less than 1 day   ART Line Arterial Line 01/07/20 Right Radial less than 1 day     DISCUSSION: The patient is critically ill with multiple organ systems failure and requires high complexity decision making for assessment and support, frequent evaluation and titration of therapies, application of advanced monitoring technologies and extensive interpretation of multiple databases.   ASSESSMENT / PLAN: Isaac Estrada is a 39yo male with past medical  history of asthma but no other medical history who presented to ED intubated in the field due to a severe asthma attack. Patient asthma is self treated without PCP supervision.  On examination the pt is unresponsive and ventilated. Wheezing is significant but improved from yesterday in ED. While in ED primary ABG  showed significant acidemia.Peak flow measurement demonstrated pt likely had air trapping resulting in hypercapnia and respiratory acidosis. ECMO consult called to ED apprec recs.  Serial ABG had shown mild improvement, and increased art 02, however AM ABG showed increased acidotic state, however, folloow up physEx showing some improvement.  ECG shows sinus tachycardia. Follow up CXR shows proper tube placement, no other pathologic findings noted.  Attempts to provide racemic epi resulted in patient's systolic BP exceeded 757 after administration. Started hydralazine push and propofol titration. Pt resumed to normal baseline shortly after and has remained since withdrawing tx.  PULMONARY Status Asthmaticus  Acute Resp Acidosis Respiratory failure- Acute hypoxic/hypercarbic Pt history is very suggestive of under/untreated asthma in an otherwise healthy patient. Patient is currently sedated. ABGs were trending in desired direction until this AM where pt remains hypercarbic w ABG 7.3 down from 7.39.  On examination, breath sounds have improved, sound stronger then previous checks. Vent goal to minimize air trapping with maintaining minimal RR TV as tolerated - Hold racemic epi -  albuterol (2.5 MG/3ML) 0.083% nebulizer solution 2.5 mg -  budesonide  nebulizer solution 0.5 mg -  ipratropium-albuterol0.5-2.5 (3) MG/3ML nebulizer solution 3 mL -  loratadine tablet 10 mg -  montelukast  tablet 10 mg - Wean as tolerated from paralytics and sedation - RASS  Hyperkalemia Likely secondary to acidosis, ECG shows no abnormal changes. Pt was given bicarb injections, will have continuous cardiac monitoring. Pt has been afebrile and is COVID neg. Nutrition consulted apprec recs, feeding tube in place for nutrition admin - Test Mg and Phosporus - Micro labs - Trend BMP (ISTAT7) - Trend CBC - Cont NS infusion  Urinary retention- Foley cath placed. Pt sedation and paralytics will be attempted to wean  -  continue foley, retrial once sedation wears off a bit   FAMILY  - Updates:   - Inter-disciplinary family meet or Palliative Care meeting due by: Occurred day [facility]  01/09/2020, 11:01 AM

## 2020-01-10 ENCOUNTER — Inpatient Hospital Stay (HOSPITAL_COMMUNITY): Payer: BLUE CROSS/BLUE SHIELD

## 2020-01-10 DIAGNOSIS — J4552 Severe persistent asthma with status asthmaticus: Principal | ICD-10-CM

## 2020-01-10 LAB — POCT I-STAT 7, (LYTES, BLD GAS, ICA,H+H)
Acid-Base Excess: 4 mmol/L — ABNORMAL HIGH (ref 0.0–2.0)
Acid-Base Excess: 3 mmol/L — ABNORMAL HIGH (ref 0.0–2.0)
Bicarbonate: 28.7 mmol/L — ABNORMAL HIGH (ref 20.0–28.0)
Bicarbonate: 27.2 mmol/L (ref 20.0–28.0)
Calcium, Ion: 1.22 mmol/L (ref 1.15–1.40)
Calcium, Ion: 1.16 mmol/L (ref 1.15–1.40)
HCT: 32 % — ABNORMAL LOW (ref 39.0–52.0)
HCT: 29 % — ABNORMAL LOW (ref 39.0–52.0)
Hemoglobin: 10.9 g/dL — ABNORMAL LOW (ref 13.0–17.0)
Hemoglobin: 9.9 g/dL — ABNORMAL LOW (ref 13.0–17.0)
O2 Saturation: 95 %
O2 Saturation: 98 %
Patient temperature: 37.6
Patient temperature: 37
Potassium: 3.5 mmol/L (ref 3.5–5.1)
Potassium: 3.5 mmol/L (ref 3.5–5.1)
Sodium: 148 mmol/L — ABNORMAL HIGH (ref 135–145)
Sodium: 148 mmol/L — ABNORMAL HIGH (ref 135–145)
TCO2: 30 mmol/L (ref 22–32)
TCO2: 28 mmol/L (ref 22–32)
pCO2 arterial: 43.9 mmHg (ref 32.0–48.0)
pCO2 arterial: 40.5 mmHg (ref 32.0–48.0)
pH, Arterial: 7.426 (ref 7.350–7.450)
pH, Arterial: 7.435 (ref 7.350–7.450)
pO2, Arterial: 76 mmHg — ABNORMAL LOW (ref 83.0–108.0)
pO2, Arterial: 94 mmHg (ref 83.0–108.0)

## 2020-01-10 LAB — BASIC METABOLIC PANEL
Anion gap: 9 (ref 5–15)
BUN: 21 mg/dL — ABNORMAL HIGH (ref 6–20)
CO2: 30 mmol/L (ref 22–32)
Calcium: 8.5 mg/dL — ABNORMAL LOW (ref 8.9–10.3)
Chloride: 109 mmol/L (ref 98–111)
Creatinine, Ser: 0.89 mg/dL (ref 0.61–1.24)
GFR calc Af Amer: >60 mL/min (ref >60)
GFR calc non Af Amer: >60 mL/min (ref >60)
Glucose, Bld: 149 mg/dL — ABNORMAL HIGH (ref 70–99)
Potassium: 4.1 mmol/L (ref 3.5–5.1)
Sodium: 148 mmol/L — ABNORMAL HIGH (ref 135–145)

## 2020-01-10 LAB — PHOSPHORUS: Phosphorus: 2.7 mg/dL (ref 2.5–4.6)

## 2020-01-10 LAB — GLUCOSE, CAPILLARY
Glucose-Capillary: 148 mg/dL — ABNORMAL HIGH (ref 70–99)
Glucose-Capillary: 140 mg/dL — ABNORMAL HIGH (ref 70–99)
Glucose-Capillary: 136 mg/dL — ABNORMAL HIGH (ref 70–99)
Glucose-Capillary: 146 mg/dL — ABNORMAL HIGH (ref 70–99)
Glucose-Capillary: 90 mg/dL (ref 70–99)
Glucose-Capillary: 103 mg/dL — ABNORMAL HIGH (ref 70–99)

## 2020-01-10 LAB — MAGNESIUM: Magnesium: 2.4 mg/dL (ref 1.7–2.4)

## 2020-01-10 MED ORDER — MAGNESIUM SULFATE 2 GM/50ML IV SOLN: 2.0000 g | Freq: Once | INTRAVENOUS | Status: DC

## 2020-01-10 MED ORDER — LIP MEDEX EX OINT
TOPICAL_OINTMENT | CUTANEOUS | Status: DC | PRN
  Filled 2020-01-10: qty 7

## 2020-01-10 MED ORDER — METHYLPREDNISOLONE SODIUM SUCC 125 MG IJ SOLR
60.0000 mg | Freq: Two times a day (BID) | INTRAMUSCULAR | Status: DC
  Administered 2020-01-10 – 2020-01-12 (×4): 60 mg via INTRAVENOUS
  Filled 2020-01-10 (×4): qty 2

## 2020-01-10 MED ORDER — ALTEPLASE 2 MG IJ SOLR
2.0000 mg | Freq: Once | INTRAMUSCULAR | Status: AC
  Administered 2020-01-10: 2 mg
  Filled 2020-01-10: qty 2

## 2020-01-10 MED ORDER — MAGNESIUM SULFATE 2 GM/50ML IV SOLN
2.0000 g | Freq: Once | INTRAVENOUS | Status: DC
  Filled 2020-01-10: qty 50

## 2020-01-10 MED ORDER — ALBUTEROL (5 MG/ML) CONTINUOUS INHALATION SOLN
10.0000 mg/h | INHALATION_SOLUTION | RESPIRATORY_TRACT | Status: DC
  Administered 2020-01-10 (×2): 10 mg/h via RESPIRATORY_TRACT
  Filled 2020-01-10: qty 20

## 2020-01-10 MED ORDER — DEXAMETHASONE SODIUM PHOSPHATE 10 MG/ML IJ SOLN
10.0000 mg | Freq: Once | INTRAMUSCULAR | Status: AC
  Administered 2020-01-10: 10 mg via INTRAVENOUS
  Filled 2020-01-10: qty 1

## 2020-01-10 MED ORDER — ORAL CARE MOUTH RINSE
15.0000 mL | Freq: Two times a day (BID) | OROMUCOSAL | Status: DC
  Administered 2020-01-10 – 2020-01-13 (×6): 15 mL via OROMUCOSAL

## 2020-01-10 MED ORDER — REVEFENACIN 175 MCG/3ML IN SOLN
175.0000 ug | Freq: Every day | RESPIRATORY_TRACT | Status: DC
  Administered 2020-01-10 – 2020-01-13 (×4): 175 ug via RESPIRATORY_TRACT
  Filled 2020-01-10 (×4): qty 3

## 2020-01-10 MED ORDER — ARFORMOTEROL TARTRATE 15 MCG/2ML IN NEBU
15.0000 ug | INHALATION_SOLUTION | Freq: Two times a day (BID) | RESPIRATORY_TRACT | Status: DC
  Administered 2020-01-10 – 2020-01-13 (×6): 15 ug via RESPIRATORY_TRACT
  Filled 2020-01-10 (×6): qty 2

## 2020-01-10 MED ORDER — HALOPERIDOL LACTATE 5 MG/ML IJ SOLN
1.0000 mg | Freq: Once | INTRAMUSCULAR | Status: AC
  Administered 2020-01-10: 1 mg via INTRAVENOUS
  Filled 2020-01-10: qty 1

## 2020-01-10 MED ORDER — MAGNESIUM SULFATE 50 % IJ SOLN
2.0000 g | Freq: Once | INTRAMUSCULAR | Status: AC
  Administered 2020-01-10: 2 g via INTRAMUSCULAR
  Filled 2020-01-10: qty 4

## 2020-01-10 MED ORDER — HYDRALAZINE HCL 20 MG/ML IJ SOLN
10.0000 mg | INTRAMUSCULAR | Status: DC | PRN
  Administered 2020-01-10: 10 mg via INTRAVENOUS
  Filled 2020-01-10: qty 1

## 2020-01-10 NOTE — Progress Notes (Signed)
MD paged concerning pt becoming combative and not following commands. An order for Haldol was given as well as having a MD come to bedside to evaluate the pt. While waiting pt became extremely agitated and more combative. More staff at bedside to assist, RRT and primary nurse were already at bedside.  Pt now with decreased breath sounds and wheezing throughout.   Once MD at bedside new orders for bilateral wrist restraints and updated order for Precedex dose.

## 2020-01-10 NOTE — Progress Notes (Signed)
Returned to check if TPA was successful. RN reports chest xray completed this morning (results as documented in report). Power flush completed to attempt flushing tip to correct position. Recommend additional chest xray to check successfulness.

## 2020-01-10 NOTE — Progress Notes (Signed)
NAME:  Isaac Estrada, MRN:  [id number], DOB:  08-Nov-1980, LOS: 3 ADMISSION DATE:  01/07/2020, CONSULTATION DATE:  01/07/2020 REFERRING MD:  Dr. Maryan Rued, CHIEF COMPLAINT:  Severe asthma attack   Brief History   39yo male presented to ED after being intubated in the field after suffering a severe asthma attack. PCCM consulted for further assistance and admission  History of present illness   Isaac Estrada is a 39yo male with past medical history of asthma but no other medical history who presented to ED intubated in the field due to a severe asthma attack. History obtain from patients spouse as patient is currently sedated and on ventilator. Per wife patient had preformed yard work yesterday per his usual and made a trip to [employer] to obtain yard care supplies and after retuning home he began experiencing shortness of breath. Patient then called his mom stating he was having progressive shortness of breath due to an asthma attack and on her arrival patient was in severe respiratory distress therefore she called 41. 2-78mins prior to their arrival patient became apneic with oxygen saturations 50% on EMS arrival, he was intubated and transported to the ED.   On arrival patient was seen tachypneic and tachycardia. ABG on arrival 7.168 / 67.7 / 206 / 24.5. Full lab work pending but CBC and partial bmet within normal limits. PCCM consulted on admission for further management and admission  Past Medical History  [place] [facility] Events   01/07/2020 Admitted and intubated in field   Consults:  ECMO   Procedures:    Significant Diagnostic Tests:  CXR 5/12 > Endotracheal tube in place.  Clear lungs.  Micro Data:  COVID 5/12 > RVP 5/12 >  Antimicrobials:     Interim history/subjective:   Somewhat combative overnight.  Still on Precedex this morning.  No other events.  Respiratory status stable.  Objective   Blood pressure 136/81, pulse 69, temperature 98.5 F (36.9 C),  temperature source Oral, resp. rate 15, height 6\' 2"  (1.88 m), weight 89.8 kg, SpO2 99 %.    Vent Mode: Other (Comment) FiO2 (%):  [36 %-40 %] 36 % Set Rate:  [16 bmp-18 bmp] 18 bmp Vt Set:  [500 mL] 500 mL PEEP:  [0 cmH20-5 cmH20] 5 cmH20 Plateau Pressure:  [15 cmH20-19 cmH20] 19 cmH20   Intake/Output Summary (Last 24 hours) at 01/10/2020 1117 Last data filed at 01/10/2020 1000 Gross per 24 hour  Intake 2556.34 ml  Output 2150 ml  Net 406.34 ml   Filed Weights   01/08/20 0400 01/09/20 0500 01/10/20 0500  Weight: 86.8 kg 89.3 kg 89.8 kg    Examination: GEN: Young gentleman, resting comfortably in bed.  Mother bedside HEENT: Alert to voice opens eyes tracks, a little sedate on Precedex CV: Regular rate rhythm S1-S2 PULM: Clear to auscultation bilaterally no crackles no wheeze GI:, Nontender nondistended EXT: No edema NEURO: Alert oriented following commands PSYCH: Calm SKIN: No obvious rash  Labs reviewed Sodium 148 Magnesium 2.4  Resolved Hospital Problem list     Assessment & Plan:   Status asthmaticus Acute hypoxic and hypercarbic respiratory failure Acute respiratory acidosis  -Remains on low-dose sedation for agitation and delirium -Continue Precedex, attempt to wean off today. -Decreasing IV steroid dosing. -Continue scheduled nebs. Albuterol prn, brovana, pulmicort, yuelpri  - continue singulair   Urinary retention- - foley for now - can consider trial off once more awake and off continuous sedation   Best practice:  Diet: Will  start TF if not ready later today for extubation Pain/Anxiety/Delirium protocol (if indicated): precedex VAP protocol (if indicated): In place DVT prophylaxis: Lovenox GI prophylaxis: PPI Glucose control: Monitor  Mobility: Bedrest Code Status: Full Family Communication: Updated at bedside  Disposition: ICU   This patient is critically ill with multiple organ system failure; which, requires frequent high complexity decision  making, assessment, support, evaluation, and titration of therapies. This was completed through the application of advanced monitoring technologies and extensive interpretation of multiple databases. During this encounter critical care time was devoted to patient care services described in this note for 33 minutes.  Josephine Igo, DO [facility] 01/10/2020 11:17 AM

## 2020-01-10 NOTE — Significant Event (Signed)
  Critical Care Note Evaluated patient bedside for significant agitation, wheezing, SOB and alteration.  He was extubated last night after intubated x 48 hours for status asthmaticus requiring neuromuscular blockade due to ventilatory dyssynchrony and high peak pressures.    Seen bedside, diffuse wheezing, moderate WOB, restless and confused, not communicative.    Gen:  Somnolent (received haldol), restless, protecting airway HEENT:  Moist MM, no stridor Chest:  Diffused scattered wheezing, moderate distress CVS:  RRR, S1S2, tachy Abd:  Soft, nontender, nondistended Ext:  No cyanosis, clubbing or edema Neuro:  Somnolent, moves all 4 extremities  Assessment: 1. Status asthmaticus 2. ICU acquired hyperactive delirium - was on versed, propofol, ketamine, fentanyl and paralyzed while intubated  Plan: 1. Treated aggressively with hour long Albuterol nebs 10mg , Mag sulfate 2gm IM, Decadron 10mg  IV 2. Increased Precedex to 1.4, can go up to 1.6 if needed + haldol as needed 3. Serial ABGs to assure CO2 not rising which would suggest impending respiratory failure 4. Reorientation, sitter bedside   Update:  Reassessed patient 2 hours after and was doing much better, calmer and communicative, less WOB and ABG improved.   Critical care time:  45 minutes.  ___________________  , MD [facility]

## 2020-01-10 NOTE — Progress Notes (Signed)
Follow up PCXR shows PICC has dropped into the SVC after power flush maneuver. TPN has been removed.

## 2020-01-10 NOTE — Progress Notes (Signed)
eLink Physician-Brief Progress Note Patient Name: Isaac Estrada DOB: 1981/05/29 MRN: [id number]   Date of Service  01/10/2020  HPI/Events of Note  Agitation - Currently on Precedex IV infuson. Nursing asking for Haldol. QTc interval = 0.35.   eICU Interventions  Will order: 1. Haldol 1 mg IV X 1 now.      Intervention Category Major Interventions: Delirium, psychosis, severe agitation - evaluation and management  Kyleah Pensabene Eugene 01/10/2020, 10:21 PM

## 2020-01-11 LAB — BPAM RBC
Blood Product Expiration Date: 202106102359
Blood Product Expiration Date: 202106102359
Blood Product Expiration Date: 202106102359
Blood Product Expiration Date: 202106102359
Unit Type and Rh: 6200
Unit Type and Rh: 6200
Unit Type and Rh: 6200
Unit Type and Rh: 6200

## 2020-01-11 LAB — TYPE AND SCREEN
ABO/RH(D): A POS
Antibody Screen: NEGATIVE
Unit division: 0
Unit division: 0
Unit division: 0
Unit division: 0

## 2020-01-11 LAB — BASIC METABOLIC PANEL
Anion gap: 8 (ref 5–15)
BUN: 20 mg/dL (ref 6–20)
CO2: 26 mmol/L (ref 22–32)
Calcium: 7.8 mg/dL — ABNORMAL LOW (ref 8.9–10.3)
Chloride: 111 mmol/L (ref 98–111)
Creatinine, Ser: 0.91 mg/dL (ref 0.61–1.24)
GFR calc Af Amer: >60 mL/min (ref >60)
GFR calc non Af Amer: >60 mL/min (ref >60)
Glucose, Bld: 122 mg/dL — ABNORMAL HIGH (ref 70–99)
Potassium: 3.4 mmol/L — ABNORMAL LOW (ref 3.5–5.1)
Sodium: 145 mmol/L (ref 135–145)

## 2020-01-11 LAB — GLUCOSE, CAPILLARY
Glucose-Capillary: 119 mg/dL — ABNORMAL HIGH (ref 70–99)
Glucose-Capillary: 101 mg/dL — ABNORMAL HIGH (ref 70–99)
Glucose-Capillary: 100 mg/dL — ABNORMAL HIGH (ref 70–99)
Glucose-Capillary: 127 mg/dL — ABNORMAL HIGH (ref 70–99)
Glucose-Capillary: 116 mg/dL — ABNORMAL HIGH (ref 70–99)

## 2020-01-11 LAB — MAGNESIUM: Magnesium: 1.7 mg/dL (ref 1.7–2.4)

## 2020-01-11 LAB — PHOSPHORUS: Phosphorus: 2.8 mg/dL (ref 2.5–4.6)

## 2020-01-11 NOTE — Progress Notes (Signed)
NAME:  Isaac Estrada, MRN:  [id number], DOB:  Aug 20, 1981, LOS: 4 ADMISSION DATE:  01/07/2020, CONSULTATION DATE:  01/07/2020 REFERRING MD:  Dr. Maryan Rued, CHIEF COMPLAINT:  Severe asthma attack   Brief History   39yo male presented to ED after being intubated in the field after suffering a severe asthma attack. PCCM consulted for further assistance and admission  History of present illness   Isaac Estrada is a 39yo male with past medical history of asthma but no other medical history who presented to ED intubated in the field due to a severe asthma attack. History obtain from patients spouse as patient is currently sedated and on ventilator. Per wife patient had preformed yard work yesterday per his usual and made a trip to [place] [employer] to obtain yard care supplies and after retuning home he began experiencing shortness of breath. Patient then called his mom stating he was having progressive shortness of breath due to an asthma attack and on her arrival patient was in severe respiratory distress therefore she called 83. 2-62mins prior to their arrival patient became apneic with oxygen saturations 50% on EMS arrival, he was intubated and transported to the ED.   On arrival patient was seen tachypneic and tachycardia. ABG on arrival 7.168 / 67.7 / 206 / 24.5. Full lab work pending but CBC and partial bmet within normal limits. PCCM consulted on admission for further management and admission  Past Medical History  [facility] Events   01/07/2020 Admitted and intubated in field   Consults:  ECMO   Procedures:    Significant Diagnostic Tests:  CXR 5/12 > Endotracheal tube in place.  Clear lungs.  Micro Data:  COVID 5/12 > RVP 5/12 >  Antimicrobials:     Interim history/subjective:   Patient had a yawning event overnight.  His jaw popped out and is now stuck.  Unable to close his mouth.  He is anxious because of this.  Objective   Blood pressure (!) 159/99, pulse 99,  temperature 98.9 F (37.2 C), temperature source Oral, resp. rate 18, height 6\' 2"  (1.88 m), weight 89 kg, SpO2 95 %.        Intake/Output Summary (Last 24 hours) at 01/11/2020 0859 Last data filed at 01/11/2020 0700 Gross per 24 hour  Intake 2264.21 ml  Output 3050 ml  Net -785.79 ml   Filed Weights   01/09/20 0500 01/10/20 0500 01/11/20 0600  Weight: 89.3 kg 89.8 kg 89 kg    Examination: GEN: Young gentleman, resting comfortably in bed, wife at bedside HEENT: Alert to voice opens follows commands tracks appropriately, lower jaw all positioned down out and anterior.  Unable to close jaw with passive range of motion or active range of motion. CV: Regular rate and rhythm S1-S2 PULM: Bilateral airflow, scattered wheezing base GI:, Nondistended nontender EXT: No significant edema NEURO: Alert oriented following commands PSYCH: Calm however a little anxious about unable to close his mouth SKIN: No rash  Labs reviewed  Resolved Hospital Problem list     Assessment & Plan:   Status asthmaticus Acute hypoxic and hypercarbic respiratory failure Acute respiratory acidosis  - remains off sedation  - continue IV steroids  - albuterol, brovana, pulmicort, yupelri  - singulair  - once jaw issue resolved we can consider moving out of ICU   Urinary retention- - removing foley today and observe   Lower Jaw, possible dislocation  - appears to be spontaneous - discussed case with ENT and oral surgery  -  we really appreciate their input and help with this  Best practice:  Diet: clears  Pain/Anxiety/Delirium protocol (if indicated): precedex VAP protocol (if indicated): In place DVT prophylaxis: Lovenox GI prophylaxis: PPI Glucose control: Monitor  Mobility: Bedrest Code Status: Full Family Communication: Updated at bedside  Disposition: ICU   Josephine Igo, DO [facility] 01/11/2020 8:59 AM

## 2020-01-11 NOTE — Procedures (Signed)
Patient with open lock jaw dislocation since 3am. Unable to reduce manually. Patient h/o similar lock 10+ years earlier, but was able to manually reduce it.  Bimanual pressure intra-orally on molars, after multiple attempts, was able to reduce jaw to normal occlusion. Ace wrap used to stabilize. Recommend ice, muscle relaxants, soft diet.

## 2020-01-11 NOTE — Consult Note (Addendum)
Reason for Consult: Jaw locked open Referring Physician: Audie Box, MD  Isaac Estrada is an 39 y.o. male admitted 01/07/2020 for severe asthma attack. Intubated in the field, admitted to icu, extubated 01/09/2020.  CC: Pt experienced open lock of the jaw 3 am. Unable to close teeth together.    HPI: Pt had similar occurrence 10+ years ago. Was able to reduce manually.  Past Medical History:  Diagnosis Date  . Allergy   . Asthma     No past surgical history on file.  Family History  Problem Relation Age of Onset  . Diabetes Mother   . Asthma Daughter   . Asthma Son   . Asthma Daughter     Social History:  reports that he has never smoked. He has never used smokeless tobacco. He reports that he does not drink alcohol or use drugs.  Allergies:  Allergies  Allergen Reactions  . Peanut-Containing Drug Products Shortness Of Breath and Swelling    Medications: I have reviewed the patient's current medications.  Results for orders placed or performed during the hospital encounter of 01/07/20 (from the past 48 hour(s))  Glucose, capillary     Status: Abnormal   Collection Time: 01/09/20 11:38 AM  Result Value Ref Range   Glucose-Capillary 130 (H) 70 - 99 mg/dL    Comment: Glucose reference range applies only to samples taken after fasting for at least 8 hours.  Glucose, capillary     Status: Abnormal   Collection Time: 01/09/20  3:59 PM  Result Value Ref Range   Glucose-Capillary 130 (H) 70 - 99 mg/dL    Comment: Glucose reference range applies only to samples taken after fasting for at least 8 hours.  Glucose, capillary     Status: None   Collection Time: 01/09/20  8:22 PM  Result Value Ref Range   Glucose-Capillary 87 70 - 99 mg/dL    Comment: Glucose reference range applies only to samples taken after fasting for at least 8 hours.  Glucose, capillary     Status: Abnormal   Collection Time: 01/09/20 11:20 PM  Result Value Ref Range   Glucose-Capillary 107 (H) 70 - 99  mg/dL    Comment: Glucose reference range applies only to samples taken after fasting for at least 8 hours.  I-STAT 7, (LYTES, BLD GAS, ICA, H+H)     Status: Abnormal   Collection Time: 01/09/20 11:43 PM  Result Value Ref Range   pH, Arterial 7.494 (H) 7.350 - 7.450   pCO2 arterial 42.0 32.0 - 48.0 mmHg   pO2, Arterial 53 (L) 83.0 - 108.0 mmHg   Bicarbonate 32.3 (H) 20.0 - 28.0 mmol/L   TCO2 34 (H) 22 - 32 mmol/L   O2 Saturation 89.0 %   Acid-Base Excess 8.0 (H) 0.0 - 2.0 mmol/L   Sodium 147 (H) 135 - 145 mmol/L   Potassium 3.7 3.5 - 5.1 mmol/L   Calcium, Ion 1.24 1.15 - 1.40 mmol/L   HCT 32.0 (L) 39.0 - 52.0 %   Hemoglobin 10.9 (L) 13.0 - 17.0 g/dL   Sample type ARTERIAL   I-STAT 7, (LYTES, BLD GAS, ICA, H+H)     Status: Abnormal   Collection Time: 01/10/20  1:48 AM  Result Value Ref Range   pH, Arterial 7.426 7.350 - 7.450   pCO2 arterial 43.9 32.0 - 48.0 mmHg   pO2, Arterial 76 (L) 83.0 - 108.0 mmHg   Bicarbonate 28.7 (H) 20.0 - 28.0 mmol/L   TCO2 30 22 - 32  mmol/L   O2 Saturation 95.0 %   Acid-Base Excess 4.0 (H) 0.0 - 2.0 mmol/L   Sodium 148 (H) 135 - 145 mmol/L   Potassium 3.5 3.5 - 5.1 mmol/L   Calcium, Ion 1.22 1.15 - 1.40 mmol/L   HCT 32.0 (L) 39.0 - 52.0 %   Hemoglobin 10.9 (L) 13.0 - 17.0 g/dL   Patient temperature 37.6 C    Sample type ARTERIAL   I-STAT 7, (LYTES, BLD GAS, ICA, H+H)     Status: Abnormal   Collection Time: 01/10/20  2:55 AM  Result Value Ref Range   pH, Arterial 7.435 7.350 - 7.450   pCO2 arterial 40.5 32.0 - 48.0 mmHg   pO2, Arterial 94 83.0 - 108.0 mmHg   Bicarbonate 27.2 20.0 - 28.0 mmol/L   TCO2 28 22 - 32 mmol/L   O2 Saturation 98.0 %   Acid-Base Excess 3.0 (H) 0.0 - 2.0 mmol/L   Sodium 148 (H) 135 - 145 mmol/L   Potassium 3.5 3.5 - 5.1 mmol/L   Calcium, Ion 1.16 1.15 - 1.40 mmol/L   HCT 29.0 (L) 39.0 - 52.0 %   Hemoglobin 9.9 (L) 13.0 - 17.0 g/dL   Patient temperature 37.0 C    Sample type ARTERIAL   Basic metabolic panel      Status: Abnormal   Collection Time: 01/10/20  4:46 AM  Result Value Ref Range   Sodium 148 (H) 135 - 145 mmol/L   Potassium 4.1 3.5 - 5.1 mmol/L   Chloride 109 98 - 111 mmol/L   CO2 30 22 - 32 mmol/L   Glucose, Bld 149 (H) 70 - 99 mg/dL    Comment: Glucose reference range applies only to samples taken after fasting for at least 8 hours.   BUN 21 (H) 6 - 20 mg/dL   Creatinine, Ser 0.89 0.61 - 1.24 mg/dL   Calcium 8.5 (L) 8.9 - 10.3 mg/dL   GFR calc non Af Amer >60 >60 mL/min   GFR calc Af Amer >60 >60 mL/min   Anion gap 9 5 - 15    Comment: Performed at [facility] [address], [place], [place] [address]  Magnesium     Status: None   Collection Time: 01/10/20  4:46 AM  Result Value Ref Range   Magnesium 2.4 1.7 - 2.4 mg/dL    Comment: Performed at [facility] [address], [place], [place] [address]  Phosphorus     Status: None   Collection Time: 01/10/20  4:46 AM  Result Value Ref Range   Phosphorus 2.7 2.5 - 4.6 mg/dL    Comment: Performed at [facility] [address] [place], [place] [address]  Glucose, capillary     Status: Abnormal   Collection Time: 01/10/20  4:49 AM  Result Value Ref Range   Glucose-Capillary 148 (H) 70 - 99 mg/dL    Comment: Glucose reference range applies only to samples taken after fasting for at least 8 hours.  Glucose, capillary     Status: Abnormal   Collection Time: 01/10/20  7:55 AM  Result Value Ref Range   Glucose-Capillary 140 (H) 70 - 99 mg/dL    Comment: Glucose reference range applies only to samples taken after fasting for at least 8 hours.  Glucose, capillary     Status: Abnormal   Collection Time: 01/10/20 12:02 PM  Result Value Ref Range   Glucose-Capillary 136 (H) 70 - 99 mg/dL    Comment: Glucose  reference range applies only to samples taken after fasting for at least 8 hours.  Glucose, capillary     Status: Abnormal   Collection Time: 01/10/20  3:33 PM  Result Value Ref Range   Glucose-Capillary 146  (H) 70 - 99 mg/dL    Comment: Glucose reference range applies only to samples taken after fasting for at least 8 hours.  Glucose, capillary     Status: None   Collection Time: 01/10/20  7:34 PM  Result Value Ref Range   Glucose-Capillary 90 70 - 99 mg/dL    Comment: Glucose reference range applies only to samples taken after fasting for at least 8 hours.  Glucose, capillary     Status: Abnormal   Collection Time: 01/10/20 11:24 PM  Result Value Ref Range   Glucose-Capillary 103 (H) 70 - 99 mg/dL    Comment: Glucose reference range applies only to samples taken after fasting for at least 8 hours.  Glucose, capillary     Status: Abnormal   Collection Time: 01/11/20  3:07 AM  Result Value Ref Range   Glucose-Capillary 119 (H) 70 - 99 mg/dL    Comment: Glucose reference range applies only to samples taken after fasting for at least 8 hours.  Basic metabolic panel     Status: Abnormal   Collection Time: 01/11/20  4:18 AM  Result Value Ref Range   Sodium 145 135 - 145 mmol/L   Potassium 3.4 (L) 3.5 - 5.1 mmol/L   Chloride 111 98 - 111 mmol/L   CO2 26 22 - 32 mmol/L   Glucose, Bld 122 (H) 70 - 99 mg/dL    Comment: Glucose reference range applies only to samples taken after fasting for at least 8 hours.   BUN 20 6 - 20 mg/dL   Creatinine, Ser 2.37 0.61 - 1.24 mg/dL   Calcium 7.8 (L) 8.9 - 10.3 mg/dL   GFR calc non Af Amer >60 >60 mL/min   GFR calc Af Amer >60 >60 mL/min   Anion gap 8 5 - 15    Comment: Performed at [facility], [address] [place], [place] [address]  Magnesium     Status: None   Collection Time: 01/11/20  4:18 AM  Result Value Ref Range   Magnesium 1.7 1.7 - 2.4 mg/dL    Comment: Performed at [facility], [address] [place], [place] [address]  Phosphorus     Status: None   Collection Time: 01/11/20  4:18 AM  Result Value Ref Range   Phosphorus 2.8 2.5 - 4.6 mg/dL    Comment: Performed at [facility], [address], [place], [place]  [address]  Glucose, capillary     Status: Abnormal   Collection Time: 01/11/20  7:50 AM  Result Value Ref Range   Glucose-Capillary 101 (H) 70 - 99 mg/dL    Comment: Glucose reference range applies only to samples taken after fasting for at least 8 hours.    DG CHEST PORT 1 VIEW  Result Date: 01/10/2020 CLINICAL DATA:  Central line. EXAM: PORTABLE CHEST 1 VIEW COMPARISON:  Chest x-ray from earlier same day. FINDINGS: LEFT-sided PICC line has been advanced and now appears well positioned with tip at the level of the lower SVC/cavoatrial junction. Lungs are clear. No pleural effusion or pneumothorax is seen. IMPRESSION: LEFT-sided PICC line now appears well positioned with tip at the level of the lower SVC/cavoatrial junction. Electronically Signed   By: Anne Ng.D.  On: 01/10/2020 10:48   DG CHEST PORT 1 VIEW  Result Date: 01/10/2020 CLINICAL DATA:  Change in mental status EXAM: PORTABLE CHEST 1 VIEW COMPARISON:  Jan 08, 2020 FINDINGS: The heart size and mediastinal contours are within normal limits. Both lungs are clear. The left-sided PICC appears to be pulled back and the tip is curled upward in the upper SVC. No acute osseous abnormality. IMPRESSION: No active disease. Left-sided PICC retracted with the tip curled upward in the upper SVC. These results will be called to the ordering clinician or representative by the Radiologist Assistant, and communication documented in the PACS or [employer]. Electronically Signed   By: Jonna Clark M.D.   On: 01/10/2020 03:25    ROS Blood pressure (!) 159/99, pulse 99, temperature 98.9 F (37.2 C), temperature source Oral, resp. rate 18, height 6\' 2"  (1.88 m), weight 89 kg, SpO2 95 %. General appearance: alert, cooperative and mild distress Head: Normocephalic, without obvious abnormality, atraumatic Eyes: negative Nose: Nares normal. Septum midline. Mucosa normal. No drainage or sinus tenderness. Throat: Jaw locked open. Healthy dentition.  Pharynx clear. No airway difficulty. Neck: no adenopathy  Assessment/Plan: Dislocation of mandible. Reduced bedside. Ace head wrap placed. Recommend soft diet, ice to TMJ, head bandage x 24h.    01/11/2020, 9:44 AM

## 2020-01-12 ENCOUNTER — Telehealth: Payer: Self-pay | Admitting: Pulmonary Disease

## 2020-01-12 ENCOUNTER — Encounter (HOSPITAL_COMMUNITY): Payer: Self-pay | Admitting: Internal Medicine

## 2020-01-12 ENCOUNTER — Other Ambulatory Visit: Payer: Self-pay

## 2020-01-12 DIAGNOSIS — J4551 Severe persistent asthma with (acute) exacerbation: Secondary | ICD-10-CM

## 2020-01-12 DIAGNOSIS — S0300XD Dislocation of jaw, unspecified side, subsequent encounter: Secondary | ICD-10-CM

## 2020-01-12 LAB — BASIC METABOLIC PANEL
Anion gap: 9 (ref 5–15)
BUN: 25 mg/dL — ABNORMAL HIGH (ref 6–20)
CO2: 25 mmol/L (ref 22–32)
Calcium: 9.1 mg/dL (ref 8.9–10.3)
Chloride: 105 mmol/L (ref 98–111)
Creatinine, Ser: 0.97 mg/dL (ref 0.61–1.24)
GFR calc Af Amer: >60 mL/min (ref >60)
GFR calc non Af Amer: >60 mL/min (ref >60)
Glucose, Bld: 117 mg/dL — ABNORMAL HIGH (ref 70–99)
Potassium: 4.1 mmol/L (ref 3.5–5.1)
Sodium: 139 mmol/L (ref 135–145)

## 2020-01-12 LAB — MAGNESIUM: Magnesium: 1.9 mg/dL (ref 1.7–2.4)

## 2020-01-12 LAB — PHOSPHORUS: Phosphorus: 4.2 mg/dL (ref 2.5–4.6)

## 2020-01-12 MED ORDER — MUPIROCIN 2 % EX OINT
1.0000 "application " | TOPICAL_OINTMENT | Freq: Two times a day (BID) | CUTANEOUS | Status: DC
  Administered 2020-01-12 – 2020-01-13 (×3): 1 via NASAL
  Filled 2020-01-12 (×2): qty 22

## 2020-01-12 MED ORDER — CHLORHEXIDINE GLUCONATE CLOTH 2 % EX PADS
6.0000 | MEDICATED_PAD | Freq: Every day | CUTANEOUS | Status: DC
  Administered 2020-01-13: 6 via TOPICAL

## 2020-01-12 MED ORDER — PREDNISONE 20 MG PO TABS
40.0000 mg | ORAL_TABLET | Freq: Every day | ORAL | Status: DC
  Administered 2020-01-13: 40 mg via ORAL
  Filled 2020-01-12: qty 2

## 2020-01-12 MED ORDER — IPRATROPIUM-ALBUTEROL 0.5-2.5 (3) MG/3ML IN SOLN
3.0000 mL | Freq: Four times a day (QID) | RESPIRATORY_TRACT | Status: DC
  Administered 2020-01-12 – 2020-01-13 (×4): 3 mL via RESPIRATORY_TRACT
  Filled 2020-01-12 (×4): qty 3

## 2020-01-12 MED ORDER — IPRATROPIUM-ALBUTEROL 0.5-2.5 (3) MG/3ML IN SOLN: 3.0000 mL | RESPIRATORY_TRACT | Status: DC | PRN

## 2020-01-12 NOTE — Progress Notes (Signed)
   NAME:  Isaac Estrada, MRN:  [id number], DOB:  03-03-1981, LOS: 5 ADMISSION DATE:  01/07/2020, CONSULTATION DATE:  01/07/2020 REFERRING MD:  Dr. Anitra Lauth, CHIEF COMPLAINT:  Severe asthma attack   Brief History   38yo male presented to ED after being intubated in the field after suffering a severe asthma attack. PCCM consulted for further assistance and admission  History of present illness   Isaac Estrada is a 39yo male with past medical history of asthma but no other medical history who presented to ED intubated in the field due to a severe asthma attack. History obtain from patients spouse as patient is currently sedated and on ventilator. Per wife patient had preformed yard work yesterday per his usual and made a trip to [employer] [employer] to obtain yard care supplies and after retuning home he began experiencing shortness of breath. Patient then called his mom stating he was having progressive shortness of breath due to an asthma attack and on her arrival patient was in severe respiratory distress therefore she called 83. 2-15mins prior to their arrival patient became apneic with oxygen saturations 50% on EMS arrival, he was intubated and transported to the ED.   On arrival patient was seen tachypneic and tachycardia. ABG on arrival 7.168 / 67.7 / 206 / 24.5. Full lab work pending but CBC and partial bmet within normal limits. PCCM consulted on admission for further management and admission  Past Medical History  Asthma   Significant Hospital Events   01/07/2020 Admitted and intubated in field   Consults:  ECMO   Procedures:    Significant Diagnostic Tests:  CXR 5/12 > Endotracheal tube in place.  Clear lungs.  Micro Data:  COVID 5/12 > RVP 5/12 >  Antimicrobials:     Interim history/subjective:   No issues overnight.  Feels much better today.  Breathing stable.  Ace wrap around head after jaw dislocation.  Objective   Blood pressure (!) 111/59, pulse 85, temperature 98.4 F (36.9  C), temperature source Oral, resp. rate 16, height 6\' 2"  (1.88 m), weight 84.6 kg, SpO2 96 %.        Intake/Output Summary (Last 24 hours) at 01/12/2020 1119 Last data filed at 01/12/2020 0545 Gross per 24 hour  Intake 722.6 ml  Output --  Net 722.6 ml   Filed Weights   01/10/20 0500 01/11/20 0600 01/12/20 0533  Weight: 89.8 kg 89 kg 84.6 kg    Examination: GEN: Young gentleman, resting comfortably in bed no distress HEENT: Ace wrap around head CV: Regular rate rhythm, S1-S2 PULM: Clear to auscultation bilaterally no crackles no wheeze GI:, Nondistended, nontender EXT: No significant edema NEURO: Alert, oriented, following commands PSYCH: Calm SKIN: No rash  Labs reviewed  Resolved Hospital Problem list     Assessment & Plan:   Status asthmaticus Acute hypoxic and hypercarbic respiratory failure Acute respiratory acidosis  -Taper from IV steroids to oral -Would taper slowly over the next week or so from oral prednisone. -At discharge new prescriptions for triple therapy -Discharge on ICS/LABA/LAMA. -Discharged on Singulair 10 mg daily -Discharge on daily antihistamine, Allegra/Claritin -We will set up outpatient follow-up at [facility] pulmonary.  Urinary retention- Resolved  Lower Jaw, possible dislocation  Likely need outpatient follow-up with oral maxillofacial surgery    [facility], DO [facility] 01/12/2020 11:19 AM

## 2020-01-12 NOTE — Telephone Encounter (Signed)
Message received by Dr. Tonia Brooms: Isaac Igo, DO  Glenford Bayley, NP; Jacquiline Doe, LPN; P [facility] Triage Pool  Hello,  Can we get this guy a follow up, hospital transition appt?  Thanks  Brad    Pt is currently still in hospital. Checking with Dr. Tonia Brooms to see how soon pt needs to have this appt scheduled and if he believes pt will be discharged soon for when we get the appt scheduled. Will go ahead and schedule this once have a response from Dr. Tonia Brooms so pt's appt will show up on AVS prior to discharge as RN will go over that paperwork with pt and he can already have the HFU on there.

## 2020-01-12 NOTE — Progress Notes (Signed)
Pt ambulated in hall. On room air. Stated when he got to the end of the hall he felt slightly SOB and tired. Now resting comfortably in chair eating lunch.

## 2020-01-12 NOTE — Progress Notes (Signed)
PROGRESS NOTE    Isaac Estrada  PYK:[id number] DOB: [birth year] DOA: 01/07/2020 PCP: Patient, No Pcp Per   Brief Narrative:  39 year old with history of asthma admitted to the hospital for severe asthma exacerbation and shortness of breath.  Initially intubated and slowly extubated.  Apparently while patient was yawning his jaw popped out, was locked open dislocated.  He was seen by dentist on 5/16 for dislocated mandible, reduced at bedside.   Assessment & Plan:   Active Problems:   Acute respiratory failure (HCC)   Acute respiratory failure/hypoxic and hypercarbic Status asthmaticus, severe exacerbation Continue current bronchodilators,Pulmicort, Yupelri No longer wheezing, transition to short course p.o. prednisone. Loratadine, Singulair, incentive spirometer/flutter Pulmonary following.  We will get their input on discharge bronchodilators.  Dislocation of mandible/lockjaw Reduced by oral surgery/dentist.  Should be able to remove Ace bandage later today, soft diet, ice to TMG.  Pain control.   DVT prophylaxis: Subcutaneous heparin Code Status: Full code Family Communication: Spouse at bedside  Status is: Inpatient    Dispo: The patient is from: Home              Anticipated d/c is to: Home              Anticipated d/c date is: 1 day              Patient currently is not medically stable to d/c.  Subjective: He is feeling much better than yesterday.  Minimal exertion or shortness of breath and jaw pain.  Tolerating clear liquid diet.  Review of Systems Otherwise negative except as per HPI, including: General: Denies fever, chills, night sweats or unintended weight loss. Resp: Denies hemoptysis Cardiac: Denies chest pain, palpitations, orthopnea, paroxysmal nocturnal dyspnea. GI: Denies abdominal pain, nausea, vomiting, diarrhea or constipation GU: Denies dysuria, frequency, hesitancy or incontinence MS: Denies muscle aches, joint pain or swelling Neuro: Denies  headache, neurologic deficits (focal weakness, numbness, tingling), abnormal gait Psych: Denies anxiety, depression, SI/HI/AVH Skin: Denies new rashes or lesions ID: Denies sick contacts, exotic exposures, travel  Examination:  Constitutional: Not in acute distress Respiratory: Clear to auscultation bilaterally Cardiovascular: Normal sinus rhythm, no rubs Abdomen: Nontender nondistended good bowel sounds Musculoskeletal: No edema noted Skin: No rashes seen Neurologic: CN 2-12 grossly intact.  And nonfocal Psychiatric: Normal judgment and insight. Alert and oriented x 3. Normal mood. Ace bandage in place around his head  Left arm PICC line in place  Objective: Vitals:   01/11/20 2100 01/12/20 0021 01/12/20 0533 01/12/20 0750  BP: 140/86 133/83 129/68 (!) 111/59  Pulse: 79 (!) 52 90 85  Resp:  16 15 16   Temp:  98.2 F (36.8 C) 99.4 F (37.4 C) 98.4 F (36.9 C)  TempSrc:  Oral Oral Oral  SpO2: 92% 98% 97% 96%  Weight:   84.6 kg   Height:        Intake/Output Summary (Last 24 hours) at 01/12/2020 0841 Last data filed at 01/12/2020 0545 Gross per 24 hour  Intake 1262.55 ml  Output -  Net 1262.55 ml   Filed Weights   01/10/20 0500 01/11/20 0600 01/12/20 0533  Weight: 89.8 kg 89 kg 84.6 kg     Data Reviewed:   CBC: Recent Labs  Lab 01/07/20 1227 01/07/20 1233 01/08/20 0330 01/08/20 0338 01/08/20 1627 01/09/20 0508 01/09/20 2343 01/10/20 0148 01/10/20 0255  WBC 6.2  --  12.4*  --   --   --   --   --   --  NEUTROABS 2.0  --   --   --   --   --   --   --   --   HGB 13.3   < > 12.7*   < > 11.2* 10.2* 10.9* 10.9* 9.9*  HCT 42.1   < > 41.2   < > 33.0* 30.0* 32.0* 32.0* 29.0*  MCV 88.3  --  88.4  --   --   --   --   --   --   PLT 309  --  272  --   --   --   --   --   --    < > = values in this interval not displayed.   Basic Metabolic Panel: Recent Labs  Lab 01/08/20 1200 01/08/20 1245 01/08/20 1627 01/08/20 1824 01/09/20 0502 01/09/20 0508 01/10/20  0148 01/10/20 0255 01/10/20 0446 01/11/20 0418 01/12/20 0500  NA 141   < >   < >  --  144   < > 148* 148* 148* 145 139  K 3.4*   < >   < >  --  4.6   < > 3.5 3.5 4.1 3.4* 4.1  CL 106  --   --   --  110  --   --   --  109 111 105  CO2 26  --   --   --  29  --   --   --  30 26 25   GLUCOSE 152*  --   --   --  136*  --   --   --  149* 122* 117*  BUN 13  --   --   --  15  --   --   --  21* 20 25*  CREATININE 1.19  --   --   --  1.14  --   --   --  0.89 0.91 0.97  CALCIUM 7.6*  --   --   --  8.2*  --   --   --  8.5* 7.8* 9.1  MG  --    < >  --  2.3 2.1  --   --   --  2.4 1.7 1.9  PHOS  --    < >  --  3.5 2.0*  --   --   --  2.7 2.8 4.2   < > = values in this interval not displayed.   GFR: Estimated Creatinine Clearance: 122.4 mL/min (by C-G formula based on SCr of 0.97 mg/dL). Liver Function Tests: Recent Labs  Lab 01/07/20 1425  AST 24  ALT 17  ALKPHOS 42  BILITOT 0.9  PROT 6.9  ALBUMIN 3.9   No results for input(s): LIPASE, AMYLASE in the last 168 hours. No results for input(s): AMMONIA in the last 168 hours. Coagulation Profile: Recent Labs  Lab 01/07/20 1425  INR 1.1   Cardiac Enzymes: No results for input(s): CKTOTAL, CKMB, CKMBINDEX, TROPONINI in the last 168 hours. BNP (last 3 results) No results for input(s): PROBNP in the last 8760 hours. HbA1C: No results for input(s): HGBA1C in the last 72 hours. CBG: Recent Labs  Lab 01/11/20 0307 01/11/20 0750 01/11/20 1126 01/11/20 1547 01/11/20 1948  GLUCAP 119* 101* 100* 127* 116*   Lipid Profile: No results for input(s): CHOL, HDL, LDLCALC, TRIG, CHOLHDL, LDLDIRECT in the last 72 hours. Thyroid Function Tests: No results for input(s): TSH, T4TOTAL, FREET4, T3FREE, THYROIDAB in the last 72 hours. Anemia Panel: No results for input(s): VITAMINB12, FOLATE,  FERRITIN, TIBC, IRON, RETICCTPCT in the last 72 hours. Sepsis Labs: Recent Labs  Lab 01/08/20 1200 01/08/20 2330  LATICACIDVEN 2.8* 1.5    Recent Results  (from the past 240 hour(s))  SARS Coronavirus 2 by RT PCR (hospital order, performed in [facility] hospital lab) Nasopharyngeal Nasopharyngeal Swab     Status: None   Collection Time: 01/07/20 12:31 PM   Specimen: Nasopharyngeal Swab  Result Value Ref Range Status   SARS Coronavirus 2 NEGATIVE NEGATIVE Final    Comment: (NOTE) SARS-CoV-2 target nucleic acids are NOT DETECTED. The SARS-CoV-2 RNA is generally detectable in upper and lower respiratory specimens during the acute phase of infection. The lowest concentration of SARS-CoV-2 viral copies this assay can detect is 250 copies / mL. A negative result does not preclude SARS-CoV-2 infection and should not be used as the sole basis for treatment or other patient management decisions.  A negative result may occur with improper specimen collection / handling, submission of specimen other than nasopharyngeal swab, presence of viral mutation(s) within the areas targeted by this assay, and inadequate number of viral copies (<250 copies / mL). A negative result must be combined with clinical observations, patient history, and epidemiological information. Fact Sheet for Patients:   BoilerBrush.com.cy Fact Sheet for Healthcare Providers: https://pope.com/ This test is not yet approved or cleared  by the [place] FDA and has been authorized for detection and/or diagnosis of SARS-CoV-2 by FDA under an Emergency Use Authorization (EUA).  This EUA will remain in effect (meaning this test can be used) for the duration of the COVID-19 declaration under Section 564(b)(1) of the Act, 21 U.S.C. section 360bbb-3(b)(1), unless the authorization is terminated or revoked sooner. Performed at [facility], [address], [place], [place] [address]   Respiratory Panel by PCR     Status: None   Collection Time: 01/07/20  1:20 PM   Specimen: Nasopharyngeal Swab; Respiratory  Result Value Ref  Range Status   Adenovirus NOT DETECTED NOT DETECTED Final   Coronavirus 229E NOT DETECTED NOT DETECTED Final    Comment: (NOTE) The Coronavirus on the Respiratory Panel, DOES NOT test for the novel  Coronavirus (2019 nCoV)    Coronavirus HKU1 NOT DETECTED NOT DETECTED Final   Coronavirus NL63 NOT DETECTED NOT DETECTED Final   Coronavirus OC43 NOT DETECTED NOT DETECTED Final   Metapneumovirus NOT DETECTED NOT DETECTED Final   Rhinovirus / Enterovirus NOT DETECTED NOT DETECTED Final   Influenza A NOT DETECTED NOT DETECTED Final   Influenza B NOT DETECTED NOT DETECTED Final   Parainfluenza Virus 1 NOT DETECTED NOT DETECTED Final   Parainfluenza Virus 2 NOT DETECTED NOT DETECTED Final   Parainfluenza Virus 3 NOT DETECTED NOT DETECTED Final   Parainfluenza Virus 4 NOT DETECTED NOT DETECTED Final   Respiratory Syncytial Virus NOT DETECTED NOT DETECTED Final   Bordetella pertussis NOT DETECTED NOT DETECTED Final   Chlamydophila pneumoniae NOT DETECTED NOT DETECTED Final   Mycoplasma pneumoniae NOT DETECTED NOT DETECTED Final    Comment: Performed at [facility], [address], [place], [place] [address]  Surgical pcr screen     Status: Abnormal   Collection Time: 01/07/20  5:14 PM   Specimen: Nasal Mucosa; Nasal Swab  Result Value Ref Range Status   MRSA, PCR NEGATIVE NEGATIVE Final   Staphylococcus aureus POSITIVE (A) NEGATIVE Final    Comment: (NOTE) The Xpert SA Assay (FDA approved for NASAL specimens in patients 12 years of age and  older), is one component of a comprehensive surveillance program. It is not intended to diagnose infection nor to guide or monitor treatment. Performed at [facility], [address], [place], [place] [address]          Radiology Studies: No results found.      Scheduled Meds: . arformoterol  15 mcg Nebulization BID  . budesonide (PULMICORT) nebulizer solution  0.5 mg Nebulization BID  . Chlorhexidine Gluconate Cloth  6 each  Topical Daily  . heparin  5,000 Units Subcutaneous Q8H  . loratadine  10 mg Oral Daily  . mouth rinse  15 mL Mouth Rinse BID  . methylPREDNISolone (SOLU-MEDROL) injection  60 mg Intravenous Q12H  . montelukast  10 mg Oral QHS  . revefenacin  175 mcg Nebulization Daily  . sodium chloride flush  10-40 mL Intracatheter Q12H   Continuous Infusions: . albuterol 10 mg/hr (01/10/20 1959)     LOS: 5 days   Time spent= 35 mins    Ankit Joline Maxcy, MD [facility]  If 7PM-7AM, please contact night-coverage  01/12/2020, 8:41 AM

## 2020-01-13 DIAGNOSIS — J45901 Unspecified asthma with (acute) exacerbation: Secondary | ICD-10-CM

## 2020-01-13 DIAGNOSIS — J45902 Unspecified asthma with status asthmaticus: Secondary | ICD-10-CM

## 2020-01-13 LAB — BASIC METABOLIC PANEL
Anion gap: 10 (ref 5–15)
BUN: 35 mg/dL — ABNORMAL HIGH (ref 6–20)
CO2: 24 mmol/L (ref 22–32)
Calcium: 8.8 mg/dL — ABNORMAL LOW (ref 8.9–10.3)
Chloride: 106 mmol/L (ref 98–111)
Creatinine, Ser: 1.26 mg/dL — ABNORMAL HIGH (ref 0.61–1.24)
GFR calc Af Amer: >60 mL/min (ref >60)
GFR calc non Af Amer: >60 mL/min (ref >60)
Glucose, Bld: 95 mg/dL (ref 70–99)
Potassium: 3.7 mmol/L (ref 3.5–5.1)
Sodium: 140 mmol/L (ref 135–145)

## 2020-01-13 LAB — PHOSPHORUS: Phosphorus: 3.6 mg/dL (ref 2.5–4.6)

## 2020-01-13 LAB — MAGNESIUM: Magnesium: 2.1 mg/dL (ref 1.7–2.4)

## 2020-01-13 MED ORDER — PREDNISONE 20 MG PO TABS: ORAL_TABLET | ORAL | 0 refills | Status: AC

## 2020-01-13 MED ORDER — MONTELUKAST SODIUM 10 MG PO TABS: 10.0000 mg | ORAL_TABLET | Freq: Every day | ORAL | 0 refills | Status: DC

## 2020-01-13 MED ORDER — MOMETASONE FURO-FORMOTEROL FUM 100-5 MCG/ACT IN AERO: 2.0000 | INHALATION_SPRAY | Freq: Two times a day (BID) | RESPIRATORY_TRACT | 0 refills | Status: DC

## 2020-01-13 MED ORDER — INCRUSE ELLIPTA 62.5 MCG/INH IN AEPB: 1.0000 | INHALATION_SPRAY | Freq: Every day | RESPIRATORY_TRACT | 0 refills | Status: DC

## 2020-01-13 MED ORDER — SODIUM CHLORIDE 0.9 % IV BOLUS
1000.0000 mL | Freq: Once | INTRAVENOUS | Status: AC
  Administered 2020-01-13: 1000 mL via INTRAVENOUS

## 2020-01-13 MED ORDER — POTASSIUM CHLORIDE CRYS ER 20 MEQ PO TBCR
40.0000 meq | EXTENDED_RELEASE_TABLET | Freq: Once | ORAL | Status: AC
  Administered 2020-01-13: 40 meq via ORAL
  Filled 2020-01-13: qty 2

## 2020-01-13 MED ORDER — LORATADINE 10 MG PO TABS: 10.0000 mg | ORAL_TABLET | Freq: Every day | ORAL | 0 refills | Status: DC

## 2020-01-13 MED ORDER — ALBUTEROL SULFATE HFA 108 (90 BASE) MCG/ACT IN AERS: 2.0000 | INHALATION_SPRAY | Freq: Four times a day (QID) | RESPIRATORY_TRACT | 1 refills | Status: DC | PRN

## 2020-01-13 MED FILL — VENTOLIN HFA 90 MCG INHALER: 108 (90 BAS | 25 days supply | Qty: 18 | Fill #0

## 2020-01-13 MED FILL — DULERA 100 MCG/5 MCG INH: 100-5 | 30 days supply | Qty: 13 | Fill #0

## 2020-01-13 MED FILL — INCRUSE ELLIPTA 62.5 MCG IN: 62.5 | 30 days supply | Qty: 30 | Fill #0

## 2020-01-13 MED FILL — MONTELUKAST SOD 10 MG TAB: 10 | 30 days supply | Qty: 30 | Fill #0

## 2020-01-13 MED FILL — LORATADINE 10 MG TABLET: 10 | 30 days supply | Qty: 30 | Fill #0

## 2020-01-13 NOTE — Progress Notes (Signed)
Pt ambulated in hall with RN. Oxygen saturation remained >95%. No complaints or distress noted.

## 2020-01-13 NOTE — Telephone Encounter (Signed)
Message below received from Dr. Tonia Brooms: Josephine Igo, DO  Jaeleigh Monaco, Farley Ly, CMA; Glenford Bayley, NP; Jacquiline Doe, LPN; P L[facility] Triage Pool; Charlott Holler, MD; 2 others  Likely released tomorrow.   Maybe follow up with APP or doc interested in severe asthma Celine Mans, [place], [place]) within the next week or two   Dx: Severe Asthma   Thanks  BLI     Pt was scheduled a consult with Dr. Celine Mans tomorrow 5/20 by Lillia Abed. Nothing further needed.

## 2020-01-13 NOTE — Discharge Summary (Signed)
Physician Discharge Summary  Rithy Mandley XBJ:[id number] DOB: May 02, 1981 DOA: 01/07/2020  PCP: Patient, No Pcp Per  Admit date: 01/07/2020 Discharge date: 01/13/2020  Admitted From: Home Disposition: Home  Recommendations for Outpatient Follow-up:  1. Follow up with PCP in 1-2 weeks 2. Please obtain BMP/CBC in one week your next doctors visit.  3. Follow-up outpatient pulmonology, to be scheduled by their service 4. Singulair daily, loratadine daily prescribed 5. Rescue inhaler prescribed 6. ICS/LAMA/LABA prescribed 7. Slow prednisone taper prescribed   Discharge Condition: Stable CODE STATUS: Full code Diet recommendation: Regular  Brief/Interim Summary: 39 year old with history of asthma admitted to the hospital for severe asthma exacerbation and shortness of breath.  Initially intubated and slowly extubated.  Apparently while patient was yawning his jaw popped out, was locked open dislocated.  He was seen by dentist on 5/16 for dislocated mandible, reduced at bedside.  Eventually his bronchodilators were transitioned to LABA/LAMA/ICS, antihistamine, Singulair.  Patient was cleared by pulmonary to be discharged.  His Ace bandage was removed on 5/17, tolerating oral diet.  Symptoms are greatly improved and stable for discharge.   Assessment & Plan:   Active Problems:   Acute respiratory failure (HCC)   Acute respiratory failure/hypoxic and hypercarbic Status asthmaticus, severe exacerbation Bronchodilators/ICS/LABA/LAMA prescribed Prednisone taper prescribed Loratadine and Singulair prescribed Pulmonary outpatient follow-up arrangements by their service  Dislocation of mandible/lockjaw This has been reduced by dentist/oral surgery.  Ace bandage removed yesterday tolerating orals without any issues.    Discharge Diagnoses:  Active Problems:   Acute respiratory failure [facility])    Consultations:  Oral surgery/dentist  Pulmonary  Subjective: Feels great no  complaints.  Discharge Exam: Vitals:   01/13/20 0809 01/13/20 0815  BP:    Pulse:    Resp:    Temp:    SpO2: 100% 99%   Vitals:   01/13/20 0804 01/13/20 0807 01/13/20 0809 01/13/20 0815  BP:      Pulse:      Resp:      Temp:      TempSrc:      SpO2: 96% 100% 100% 99%  Weight:      Height:        General: Pt is alert, awake, not in acute distress Cardiovascular: RRR, S1/S2 +, no rubs, no gallops Respiratory: CTA bilaterally, no wheezing, no rhonchi Abdominal: Soft, NT, ND, bowel sounds + Extremities: no edema, no cyanosis  Discharge Instructions   Allergies as of 01/13/2020      Reactions   Peanut-containing Drug Products Shortness Of Breath, Swelling      Medication List    STOP taking these medications   Albuterol Sulfate 108 (90 Base) MCG/ACT Aepb Commonly known as: ProAir RespiClick Replaced by: albuterol 108 (90 Base) MCG/ACT inhaler     TAKE these medications   albuterol 108 (90 Base) MCG/ACT inhaler Commonly known as: VENTOLIN HFA Inhale 2 puffs into the lungs every 6 (six) hours as needed for wheezing or shortness of breath. Replaces: Albuterol Sulfate 108 (90 Base) MCG/ACT Aepb   budesonide-formoterol 80-4.5 MCG/ACT inhaler Commonly known as: SYMBICORT Inhale 2 puffs into the lungs 2 (two) times daily.   Incruse Ellipta 62.5 MCG/INH Aepb Generic drug: umeclidinium bromide Inhale 1 puff into the lungs daily for 30 doses.   loratadine 10 MG tablet Commonly known as: CLARITIN Take 1 tablet (10 mg total) by mouth daily. Start taking on: Jan 14, 2020   loratadine-pseudoephedrine 10-240 MG 24 hr tablet Commonly known as: CLARITIN-D 24-hour Take 1 tablet  by mouth as needed for allergies.   mometasone-formoterol 100-5 MCG/ACT Aero Commonly known as: DULERA Inhale 2 puffs into the lungs 2 (two) times daily.   montelukast 10 MG tablet Commonly known as: SINGULAIR Take 1 tablet (10 mg total) by mouth at bedtime.   predniSONE 20 MG  tablet Commonly known as: DELTASONE Take 2 tablets (40 mg total) by mouth daily with breakfast for 2 days, THEN 1.5 tablets (30 mg total) daily with breakfast for 2 days, THEN 1 tablet (20 mg total) daily with breakfast for 2 days, THEN 0.5 tablets (10 mg total) daily with breakfast for 2 days. Start taking on: Jan 14, 2020       Allergies  Allergen Reactions  . Peanut-Containing Drug Products Shortness Of Breath and Swelling    You were cared for by a hospitalist during your hospital stay. If you have any questions about your discharge medications or the care you received while you were in the hospital after you are discharged, you can call the unit and asked to speak with the hospitalist on call if the hospitalist that took care of you is not available. Once you are discharged, your primary care physician will handle any further medical issues. Please note that no refills for any discharge medications will be authorized once you are discharged, as it is imperative that you return to your primary care physician (or establish a relationship with a primary care physician if you do not have one) for your aftercare needs so that they can reassess your need for medications and monitor your lab values.   Procedures/Studies: DG CHEST PORT 1 VIEW  Result Date: 01/10/2020 CLINICAL DATA:  Central line. EXAM: PORTABLE CHEST 1 VIEW COMPARISON:  Chest x-ray from earlier same day. FINDINGS: LEFT-sided PICC line has been advanced and now appears well positioned with tip at the level of the lower SVC/cavoatrial junction. Lungs are clear. No pleural effusion or pneumothorax is seen. IMPRESSION: LEFT-sided PICC line now appears well positioned with tip at the level of the lower SVC/cavoatrial junction. Electronically Signed   By: Bary Richard M.D.   On: 01/10/2020 10:48   DG CHEST PORT 1 VIEW  Result Date: 01/10/2020 CLINICAL DATA:  Change in mental status EXAM: PORTABLE CHEST 1 VIEW COMPARISON:  Jan 08, 2020  FINDINGS: The heart size and mediastinal contours are within normal limits. Both lungs are clear. The left-sided PICC appears to be pulled back and the tip is curled upward in the upper SVC. No acute osseous abnormality. IMPRESSION: No active disease. Left-sided PICC retracted with the tip curled upward in the upper SVC. These results will be called to the ordering clinician or representative by the Radiologist Assistant, and communication documented in the PACS or [employer]. Electronically Signed   By: Jonna Clark M.D.   On: 01/10/2020 03:25   DG CHEST PORT 1 VIEW  Result Date: 01/08/2020 CLINICAL DATA:  Respiratory failure. EXAM: PORTABLE CHEST 1 VIEW COMPARISON:  Chest x-ray from yesterday. FINDINGS: Unchanged endotracheal tube. New enteric tube in the stomach. New left upper extremity PICC line with tip near the cavoatrial junction. The heart size and mediastinal contours are within normal limits. Normal pulmonary vascularity. No focal consolidation, pleural effusion, or pneumothorax. No acute osseous abnormality. IMPRESSION: 1. No active disease.  Lines and tubes as above. Electronically Signed   By: Obie Dredge M.D.   On: 01/08/2020 08:26   DG Chest Portable 1 View  Result Date: 01/07/2020 CLINICAL DATA:  Intubation EXAM: PORTABLE  CHEST 1 VIEW COMPARISON:  None. FINDINGS: Endotracheal tube is approximately 6 cm above the carina. Lungs are clear. No pleural effusion or pneumothorax. Cardiomediastinal contours are within normal limits with normal heart size. IMPRESSION: Endotracheal tube in place.  Clear lungs. Electronically Signed   By: Guadlupe Spanish M.D.   On: 01/07/2020 12:39   ECHOCARDIOGRAM COMPLETE  Result Date: 01/07/2020    ECHOCARDIOGRAM REPORT   Patient Name:   VICTORIA EUCEDA Date of Exam: 01/07/2020 Medical Rec #:  [id number]    Height:       73.5 in Accession #:    [id number]   Weight:       191.8 lb Date of Birth:  November 23, 1980   BSA:          2.124 m Patient Age:    38 years      BP:           137/73 mmHg Patient Gender: M            HR:           101 bpm. Exam Location:  Inpatient Procedure: 2D Echo STAT ECHO Indications:   pre ECMO evaluation  History:       Patient has no prior history of Echocardiogram examinations.                Asthma.  Sonographer:   Delcie Roch Referring      [address] Phys: IMPRESSIONS  1. Left ventricular ejection fraction, by estimation, is 60 to 65%. The left ventricle has normal function. The left ventricle has no regional wall motion abnormalities. Left ventricular diastolic parameters were normal.  2. Right ventricular systolic function is normal. The right ventricular size is normal.  3. The mitral valve is normal in structure. Trivial mitral valve regurgitation. No evidence of mitral stenosis.  4. The aortic valve is normal in structure. Aortic valve regurgitation is not visualized. No aortic stenosis is present.  5. The inferior vena cava is normal in size with greater than 50% respiratory variability, suggesting right atrial pressure of 3 mmHg. FINDINGS  Left Ventricle: Left ventricular ejection fraction, by estimation, is 60 to 65%. The left ventricle has normal function. The left ventricle has no regional wall motion abnormalities. The left ventricular internal cavity size was normal in size. There is  no left ventricular hypertrophy. Left ventricular diastolic parameters were normal. Normal left ventricular filling pressure. Right Ventricle: The right ventricular size is normal. No increase in right ventricular wall thickness. Right ventricular systolic function is normal. Left Atrium: Left atrial size was normal in size. Right Atrium: Right atrial size was normal in size. Pericardium: There is no evidence of pericardial effusion. Mitral Valve: The mitral valve is normal in structure. Normal mobility of the mitral valve leaflets. Trivial mitral valve regurgitation. No evidence of mitral valve stenosis. Tricuspid Valve: The tricuspid  valve is normal in structure. Tricuspid valve regurgitation is trivial. No evidence of tricuspid stenosis. Aortic Valve: The aortic valve is normal in structure. Aortic valve regurgitation is not visualized. No aortic stenosis is present. Pulmonic Valve: The pulmonic valve was normal in structure. Pulmonic valve regurgitation is not visualized. No evidence of pulmonic stenosis. Aorta: The aortic root is normal in size and structure. Venous: The inferior vena cava is normal in size with greater than 50% respiratory variability, suggesting right atrial pressure of 3 mmHg. IAS/Shunts: No atrial level shunt detected by color flow Doppler.  LEFT VENTRICLE PLAX 2D LVIDd:  4.60 cm  Diastology LVIDs:         3.30 cm  LV e' lateral:   15.80 cm/s LV PW:         0.90 cm  LV E/e' lateral: 6.0 LV IVS:        0.80 cm  LV e' medial:    17.30 cm/s LVOT diam:     1.70 cm  LV E/e' medial:  5.5 LV SV:         48 LV SV Index:   22 LVOT Area:     2.27 cm  RIGHT VENTRICLE RV S prime:     17.60 cm/s TAPSE (M-mode): 2.8 cm LEFT ATRIUM             Index       RIGHT ATRIUM          Index LA diam:        3.20 cm 1.51 cm/m  RA Area:     8.73 cm LA Vol (A2C):   32.5 ml 15.30 ml/m RA Volume:   15.90 ml 7.48 ml/m LA Vol (A4C):   21.2 ml 9.98 ml/m LA Biplane Vol: 27.4 ml 12.90 ml/m  AORTIC VALVE LVOT Vmax:   114.00 cm/s LVOT Vmean:  72.200 cm/s LVOT VTI:    0.210 m MITRAL VALVE MV Area (PHT): 3.48 cm    SHUNTS MV Decel Time: 218 msec    Systemic VTI:  0.21 m MV E velocity: 95.50 cm/s  Systemic Diam: 1.70 cm MV A velocity: 67.70 cm/s MV E/A ratio:  1.41 Ena Dawley MD Electronically signed by Ena Dawley MD Signature Date/Time: 01/07/2020/3:33:15 PM    Final    [place] EKG SITE RITE  Result Date: 01/07/2020 If Site Rite image not attached, placement could not be confirmed due to current cardiac rhythm.     The results of significant diagnostics from this hospitalization (including imaging, microbiology, ancillary and  laboratory) are listed below for reference.     Microbiology: Recent Results (from the past 240 hour(s))  SARS Coronavirus 2 by RT PCR (hospital order, performed in [facility] hospital lab) Nasopharyngeal Nasopharyngeal Swab     Status: None   Collection Time: 01/07/20 12:31 PM   Specimen: Nasopharyngeal Swab  Result Value Ref Range Status   SARS Coronavirus 2 NEGATIVE NEGATIVE Final    Comment: (NOTE) SARS-CoV-2 target nucleic acids are NOT DETECTED. The SARS-CoV-2 RNA is generally detectable in upper and lower respiratory specimens during the acute phase of infection. The lowest concentration of SARS-CoV-2 viral copies this assay can detect is 250 copies / mL. A negative result does not preclude SARS-CoV-2 infection and should not be used as the sole basis for treatment or other patient management decisions.  A negative result may occur with improper specimen collection / handling, submission of specimen other than nasopharyngeal swab, presence of viral mutation(s) within the areas targeted by this assay, and inadequate number of viral copies (<250 copies / mL). A negative result must be combined with clinical observations, patient history, and epidemiological information. Fact Sheet for Patients:   StrictlyIdeas.no Fact Sheet for Healthcare Providers: BankingDealers.co.za This test is not yet approved or cleared  by the [place] FDA and has been authorized for detection and/or diagnosis of SARS-CoV-2 by FDA under an Emergency Use Authorization (EUA).  This EUA will remain in effect (meaning this test can be used) for the duration of the COVID-19 declaration under Section 564(b)(1) of the Act, 21 U.S.C. section 360bbb-3(b)(1), unless the authorization  is terminated or revoked sooner. Performed at [facility], [address] [place], [place] [address]   Respiratory Panel by PCR     Status: None   Collection Time:  01/07/20  1:20 PM   Specimen: Nasopharyngeal Swab; Respiratory  Result Value Ref Range Status   Adenovirus NOT DETECTED NOT DETECTED Final   Coronavirus 229E NOT DETECTED NOT DETECTED Final    Comment: (NOTE) The Coronavirus on the Respiratory Panel, DOES NOT test for the novel  Coronavirus (2019 nCoV)    Coronavirus HKU1 NOT DETECTED NOT DETECTED Final   Coronavirus NL63 NOT DETECTED NOT DETECTED Final   Coronavirus OC43 NOT DETECTED NOT DETECTED Final   Metapneumovirus NOT DETECTED NOT DETECTED Final   Rhinovirus / Enterovirus NOT DETECTED NOT DETECTED Final   Influenza A NOT DETECTED NOT DETECTED Final   Influenza B NOT DETECTED NOT DETECTED Final   Parainfluenza Virus 1 NOT DETECTED NOT DETECTED Final   Parainfluenza Virus 2 NOT DETECTED NOT DETECTED Final   Parainfluenza Virus 3 NOT DETECTED NOT DETECTED Final   Parainfluenza Virus 4 NOT DETECTED NOT DETECTED Final   Respiratory Syncytial Virus NOT DETECTED NOT DETECTED Final   Bordetella pertussis NOT DETECTED NOT DETECTED Final   Chlamydophila pneumoniae NOT DETECTED NOT DETECTED Final   Mycoplasma pneumoniae NOT DETECTED NOT DETECTED Final    Comment: Performed at [facility], [address] [place], [place] [address]  Surgical pcr screen     Status: Abnormal   Collection Time: 01/07/20  5:14 PM   Specimen: Nasal Mucosa; Nasal Swab  Result Value Ref Range Status   MRSA, PCR NEGATIVE NEGATIVE Final   Staphylococcus aureus POSITIVE (A) NEGATIVE Final    Comment: (NOTE) The Xpert SA Assay (FDA approved for NASAL specimens in patients 37 years of age and older), is one component of a comprehensive surveillance program. It is not intended to diagnose infection nor to guide or monitor treatment. Performed at [facility], [address] [place], [place] [address]      Labs: BNP (last 3 results) No results for input(s): BNP in the last 8760 hours. Basic Metabolic Panel: Recent Labs  Lab 01/09/20 0502  01/09/20 0508 01/10/20 0255 01/10/20 0446 01/11/20 0418 01/12/20 0500 01/13/20 0358  NA 144   < > 148* 148* 145 139 140  K 4.6   < > 3.5 4.1 3.4* 4.1 3.7  CL 110  --   --  109 111 105 106  CO2 29  --   --  30 26 25 24   GLUCOSE 136*  --   --  149* 122* 117* 95  BUN 15  --   --  21* 20 25* 35*  CREATININE 1.14  --   --  0.89 0.91 0.97 1.26*  CALCIUM 8.2*  --   --  8.5* 7.8* 9.1 8.8*  MG 2.1  --   --  2.4 1.7 1.9 2.1  PHOS 2.0*  --   --  2.7 2.8 4.2 3.6   < > = values in this interval not displayed.   Liver Function Tests: Recent Labs  Lab 01/07/20 1425  AST 24  ALT 17  ALKPHOS 42  BILITOT 0.9  PROT 6.9  ALBUMIN 3.9   No results for input(s): LIPASE, AMYLASE in the last 168 hours. No results for input(s): AMMONIA in the last 168 hours. CBC: Recent Labs  Lab 01/07/20 1227 01/07/20 1233 01/08/20 0330 01/08/20 0338 01/08/20 1627 01/09/20 0508 01/09/20  2343 01/10/20 0148 01/10/20 0255  WBC 6.2  --  12.4*  --   --   --   --   --   --   NEUTROABS 2.0  --   --   --   --   --   --   --   --   HGB 13.3   < > 12.7*   < > 11.2* 10.2* 10.9* 10.9* 9.9*  HCT 42.1   < > 41.2   < > 33.0* 30.0* 32.0* 32.0* 29.0*  MCV 88.3  --  88.4  --   --   --   --   --   --   PLT 309  --  272  --   --   --   --   --   --    < > = values in this interval not displayed.   Cardiac Enzymes: No results for input(s): CKTOTAL, CKMB, CKMBINDEX, TROPONINI in the last 168 hours. BNP: Invalid input(s): POCBNP CBG: Recent Labs  Lab 01/11/20 0307 01/11/20 0750 01/11/20 1126 01/11/20 1547 01/11/20 1948  GLUCAP 119* 101* 100* 127* 116*   D-Dimer No results for input(s): DDIMER in the last 72 hours. Hgb A1c No results for input(s): HGBA1C in the last 72 hours. Lipid Profile No results for input(s): CHOL, HDL, LDLCALC, TRIG, CHOLHDL, LDLDIRECT in the last 72 hours. Thyroid function studies No results for input(s): TSH, T4TOTAL, T3FREE, THYROIDAB in the last 72 hours.  Invalid input(s):  FREET3 Anemia work up No results for input(s): VITAMINB12, FOLATE, FERRITIN, TIBC, IRON, RETICCTPCT in the last 72 hours. Urinalysis    Component Value Date/Time   COLORURINE YELLOW 01/07/2020 1329   APPEARANCEUR CLEAR 01/07/2020 1329   LABSPEC 1.016 01/07/2020 1329   PHURINE 5.0 01/07/2020 1329   GLUCOSEU 150 (A) 01/07/2020 1329   HGBUR NEGATIVE 01/07/2020 1329   BILIRUBINUR NEGATIVE 01/07/2020 1329   BILIRUBINUR negative 06/16/2019 1742   KETONESUR NEGATIVE 01/07/2020 1329   PROTEINUR 30 (A) 01/07/2020 1329   UROBILINOGEN 0.2 06/16/2019 1742   NITRITE NEGATIVE 01/07/2020 1329   LEUKOCYTESUR NEGATIVE 01/07/2020 1329   Sepsis Labs Invalid input(s): PROCALCITONIN,  WBC,  LACTICIDVEN Microbiology Recent Results (from the past 240 hour(s))  SARS Coronavirus 2 by RT PCR (hospital order, performed in [facility] hospital lab) Nasopharyngeal Nasopharyngeal Swab     Status: None   Collection Time: 01/07/20 12:31 PM   Specimen: Nasopharyngeal Swab  Result Value Ref Range Status   SARS Coronavirus 2 NEGATIVE NEGATIVE Final    Comment: (NOTE) SARS-CoV-2 target nucleic acids are NOT DETECTED. The SARS-CoV-2 RNA is generally detectable in upper and lower respiratory specimens during the acute phase of infection. The lowest concentration of SARS-CoV-2 viral copies this assay can detect is 250 copies / mL. A negative result does not preclude SARS-CoV-2 infection and should not be used as the sole basis for treatment or other patient management decisions.  A negative result may occur with improper specimen collection / handling, submission of specimen other than nasopharyngeal swab, presence of viral mutation(s) within the areas targeted by this assay, and inadequate number of viral copies (<250 copies / mL). A negative result must be combined with clinical observations, patient history, and epidemiological information. Fact Sheet for Patients:    BoilerBrush.com.cy Fact Sheet for Healthcare Providers: https://pope.com/ This test is not yet approved or cleared  by the [place] FDA and has been authorized for detection and/or diagnosis of SARS-CoV-2 by FDA under an Emergency Use  Authorization (EUA).  This EUA will remain in effect (meaning this test can be used) for the duration of the COVID-19 declaration under Section 564(b)(1) of the Act, 21 U.S.C. section 360bbb-3(b)(1), unless the authorization is terminated or revoked sooner. Performed at [facility], [address], [place], [place] [address]   Respiratory Panel by PCR     Status: None   Collection Time: 01/07/20  1:20 PM   Specimen: Nasopharyngeal Swab; Respiratory  Result Value Ref Range Status   Adenovirus NOT DETECTED NOT DETECTED Final   Coronavirus 229E NOT DETECTED NOT DETECTED Final    Comment: (NOTE) The Coronavirus on the Respiratory Panel, DOES NOT test for the novel  Coronavirus (2019 nCoV)    Coronavirus HKU1 NOT DETECTED NOT DETECTED Final   Coronavirus NL63 NOT DETECTED NOT DETECTED Final   Coronavirus OC43 NOT DETECTED NOT DETECTED Final   Metapneumovirus NOT DETECTED NOT DETECTED Final   Rhinovirus / Enterovirus NOT DETECTED NOT DETECTED Final   Influenza A NOT DETECTED NOT DETECTED Final   Influenza B NOT DETECTED NOT DETECTED Final   Parainfluenza Virus 1 NOT DETECTED NOT DETECTED Final   Parainfluenza Virus 2 NOT DETECTED NOT DETECTED Final   Parainfluenza Virus 3 NOT DETECTED NOT DETECTED Final   Parainfluenza Virus 4 NOT DETECTED NOT DETECTED Final   Respiratory Syncytial Virus NOT DETECTED NOT DETECTED Final   Bordetella pertussis NOT DETECTED NOT DETECTED Final   Chlamydophila pneumoniae NOT DETECTED NOT DETECTED Final   Mycoplasma pneumoniae NOT DETECTED NOT DETECTED Final    Comment: Performed at [facility], [address], [place], [place] [address]  Surgical pcr screen      Status: Abnormal   Collection Time: 01/07/20  5:14 PM   Specimen: Nasal Mucosa; Nasal Swab  Result Value Ref Range Status   MRSA, PCR NEGATIVE NEGATIVE Final   Staphylococcus aureus POSITIVE (A) NEGATIVE Final    Comment: (NOTE) The Xpert SA Assay (FDA approved for NASAL specimens in patients 68 years of age and older), is one component of a comprehensive surveillance program. It is not intended to diagnose infection nor to guide or monitor treatment. Performed at [facility], [address], [place], [place] [address]      Time coordinating discharge:  I have spent 35 minutes face to face with the patient and on the ward discussing the patients care, assessment, plan and disposition with other care givers. >50% of the time was devoted counseling the patient about the risks and benefits of treatment/Discharge disposition and coordinating care.   SIGNED:   Dimple Nanas, MD  [facility] 01/13/2020, 11:08 AM   If 7PM-7AM, please contact night-coverage \

## 2020-01-15 ENCOUNTER — Other Ambulatory Visit: Payer: Self-pay

## 2020-01-15 ENCOUNTER — Ambulatory Visit: Payer: BLUE CROSS/BLUE SHIELD | Admitting: Internal Medicine

## 2020-01-15 ENCOUNTER — Encounter: Payer: Self-pay | Admitting: Internal Medicine

## 2020-01-15 ENCOUNTER — Other Ambulatory Visit: Payer: BLUE CROSS/BLUE SHIELD

## 2020-01-15 VITALS — BP 90/66 | HR 74 | Temp 98.2°F | Ht 73.5 in | Wt 185.0 lb

## 2020-01-15 DIAGNOSIS — J4552 Severe persistent asthma with status asthmaticus: Secondary | ICD-10-CM | POA: Diagnosis not present

## 2020-01-15 DIAGNOSIS — J301 Allergic rhinitis due to pollen: Secondary | ICD-10-CM

## 2020-01-15 MED ORDER — ALBUTEROL SULFATE HFA 108 (90 BASE) MCG/ACT IN AERS: 2.0000 | INHALATION_SPRAY | Freq: Four times a day (QID) | RESPIRATORY_TRACT | 11 refills | Status: DC | PRN

## 2020-01-15 MED ORDER — EPINEPHRINE 0.3 MG/0.3ML IJ SOAJ: 0.3000 mg | Freq: Once | INTRAMUSCULAR | 0 refills | Status: AC

## 2020-01-15 MED ORDER — MONTELUKAST SODIUM 10 MG PO TABS: 10.0000 mg | ORAL_TABLET | Freq: Every day | ORAL | 11 refills | Status: DC

## 2020-01-15 MED ORDER — MOMETASONE FURO-FORMOTEROL FUM 100-5 MCG/ACT IN AERO: 2.0000 | INHALATION_SPRAY | Freq: Two times a day (BID) | RESPIRATORY_TRACT | 11 refills | Status: DC

## 2020-01-15 MED ORDER — ALBUTEROL SULFATE (2.5 MG/3ML) 0.083% IN NEBU: 2.5000 mg | INHALATION_SOLUTION | Freq: Four times a day (QID) | RESPIRATORY_TRACT | 12 refills | Status: AC | PRN

## 2020-01-15 MED ORDER — CETIRIZINE HCL 10 MG PO TABS: 10.0000 mg | ORAL_TABLET | Freq: Every day | ORAL | 11 refills | Status: DC

## 2020-01-15 MED ORDER — INCRUSE ELLIPTA 62.5 MCG/INH IN AEPB: 1.0000 | INHALATION_SPRAY | Freq: Every day | RESPIRATORY_TRACT | 11 refills | Status: AC

## 2020-01-15 NOTE — Patient Instructions (Addendum)
The patient should have follow up scheduled with myself in 4 months.   Prior to next visit patient should have: Spirometry/Feno Blood work   Continue incruse, dulera, and albuterol as needed Change the claritin to cetirizine. Continue montelukast.   Take the albuterol rescue inhaler every 4 to 6 hours as needed for wheezing or shortness of breath. You can also take it 15 minutes before exercise or exertional activity. Side effects include heart racing or pounding, jitters or anxiety. If you have a history of an irregular heart rhythm, it can make this worse. Can also give some patients a hard time sleeping.  To inhale the aerosol using an inhaler, follow these steps:  1. Remove the protective dust cap from the end of the mouthpiece. If the dust cap was not placed on the mouthpiece, check the mouthpiece for dirt or other objects. Be sure that the canister is fully and firmly inserted in the mouthpiece. 2. If you are using the inhaler for the first time or if you have not used the inhaler in more than 14 days, you will need to prime it. You may also need to prime the inhaler if it has been dropped. Ask your pharmacist or check the manufacturer's information if this happens. To prime the inhaler, shake it well and then press down on the canister 4 times to release 4 sprays into the air, away from your face. Be careful not to get albuterol in your eyes. 3. Shake the inhaler well. 4. Breathe out as completely as possible through your mouth. 4. Hold the canister with the mouthpiece on the bottom, facing you and the canister pointing upward. Place the open end of the mouthpiece into your mouth. Close your lips tightly around the mouthpiece. 6. Breathe in slowly and deeply through the mouthpiece.At the same time, press down once on the container to spray the medication into your mouth. 7. Try to hold your breath for 10 seconds. remove the inhaler, and breathe out slowly. 8. If you were told to use 2  puffs, wait 1 minute and then repeat steps 3-7. 9. Replace the protective cap on the inhaler. 10. Clean your inhaler regularly. Follow the manufacturer's directions carefully and ask your doctor or pharmacist if you have any questions about cleaning your inhaler.  Check the back of the inhaler to keep track of the total number of doses left on the inhaler.

## 2020-01-15 NOTE — Progress Notes (Signed)
Isaac Estrada    [id number]    1981-08-22  Primary Care Physician:Patient, No Pcp Per  Referring Physician: Dr. Valeta Harms Reason for Consultation: asthma Date of Consultation: 01/15/2020  Chief complaint:   Chief Complaint  Patient presents with  . Consult    asthma, d/c 5/18.       HPI: Isaac Estrada is a 39 y.o. gentleman with severe uncontrolled asthma with recent hospitalization.  Was in the ICU with status asthmaticus requiring mechanical ventilation and neuromuscular blockade.  Diagnosed with asthma as a child. Had a couple ED visits in childhood but never intubated.   Was using his step father's inhalers symbicort and albuterol prior to this hospital stay. Was taking symbicort BID. The albuterol was used at least twice a week prior to this hospital stay. Leading up to this hospital stay he: Exercised that morning, was running errands ([employer], [employer]) was around a friend who wears a lot of cologne and had a dog at his house. Also had done some vacuuming the day before in the house and mowed the lawn the day before as well. He usually wears a mask before doing these tasks and if he wheezes he uses the albuterol   Allergies improved with singulair but claritin alone doesn't seem to be enough.  Peanut allergy is listed, but he eats peanut butter regularly. Notes tingling in his lips and gums when he eats certain fruits. Daughter has banana allergy, son has severe peanut allergy. Wondering if he needs an epi-pen with him.   Currently on prednisone taper - hasn't needed albuterol at all since discharge.   Current Regimen: currently on prednisone taper, Dulera, incruse, prn albuterol Asthma Triggers: seasonal allergies.  Exacerbations in the last year: 1  History of hospitalization or intubation: yes most recently May 2021.  Hives: none Allergy Testing: never had.  GERD: Allergic Rhinitis: claritin, singulair  ACT:  Asthma Control Test ACT Total Score  01/15/2020 16    FeNO: never had.    Social history:  Occupation: Building services engineer business, self employed.  Lives at home: wife, son and daughter, Isaac Estrada shih-tzu is there every other week. Carpet in the home. Mattress is memory foam with pillow top, pillows are memory foam, no down filling. Isaac Estrada has down filling.  Smoking history: no passive smoke exposure, never smoker.   Social History   Occupational History  . Occupation: unemployed  Tobacco Use  . Smoking status: Never Smoker  . Smokeless tobacco: Never Used  Substance and Sexual Activity  . Alcohol use: No  . Drug use: No  . Sexual activity: Yes    Relevant family history:  Family History  Problem Relation Age of Onset  . Diabetes Mother   . Asthma Daughter   . Asthma Son   . Asthma Daughter     Past Medical History:  Diagnosis Date  . Allergy   . Asthma    History reviewed. No pertinent surgical history.  Physical Exam: Blood pressure 90/66, pulse 74, temperature 98.2 F (36.8 C), temperature source Temporal, height 6' 1.5" (1.867 m), weight 185 lb (83.9 kg), SpO2 98 %. Gen:      No acute distress ENT:  no nasal polyps, mucus membranes moist, mild nasal erythema Lungs:    No increased respiratory effort, symmetric chest wall excursion, clear to auscultation bilaterally, no wheezes or crackles CV:         Regular rate and rhythm; no murmurs, rubs, or gallops.  No pedal  edema Abd:      + bowel sounds; soft, non-tender; no distension MSK: no acute synovitis of DIP or PIP joints, no mechanics hands.  Skin:      Warm and dry; no rashes, multiple tattoos  Neuro: normal speech, no focal facial asymmetry Psych: alert and oriented x3, normal mood and affect   Data Reviewed/Medical Decision Making:  Independent interpretation of tests: Imaging: . Review of patient's chest xray images revealed hyperinflation. The patient's images have been independently reviewed by me.    PFTs: Spirometry 2016 shows severe airflow  limitation with +BD response.   Labs: CBC from inpatient May 2021, no peripheral eosionphilia  Echocardiogram May 2021  1. Left ventricular ejection fraction, by estimation, is 60 to 65%. The  left ventricle has normal function. The left ventricle has no regional  wall motion abnormalities. Left ventricular diastolic parameters were  normal.  2. Right ventricular systolic function is normal. The right ventricular  size is normal.  3. The mitral valve is normal in structure. Trivial mitral valve  regurgitation. No evidence of mitral stenosis.  4. The aortic valve is normal in structure. Aortic valve regurgitation is  not visualized. No aortic stenosis is present.  5. The inferior vena cava is normal in size with greater than 50%  respiratory variability, suggesting right atrial pressure of 3 mmHg.    . I reviewed prior external note(s) from hospital stay . I reviewed the result(s) of the labs and imaging as noted above.  . I have ordered spirometry, FeNO  Discussion of management or test interpretation with another colleague Dr. Tonia Brooms   Assessment:  Severe Asthma - with history of intubation Allergic Rhinitis Possible Food allergies   Plan/Recommendations: Continue Dulera, incurse as needed albuterol Nebulizer machine for home Will switch from claritin to cetirizine. Continue montelukast.  Concern for food allergies. Will refer to [facility] allergy. Dr. Dellis Anes or [facility] for this. Educated on appropriate use of epi pen.  Will send for region 2 allergy panel immunocap testing, IgE  We discussed disease management and progression at length today. Discussed return to care precautions for asthma to clinic vs when to call 911 emergently for help.  Will need to develop asthma action plan with new PFTs at next visit.   I spent 60 minutes in the care of this patient today including pre-charting, chart review, review of results, face-to-face care, coordination of care and communication  with consultants etc.).  Return to Care: No follow-ups on file.  Durel Salts, MD Pulmonary and Critical Care Medicine [facility] [phone]  CC: No ref. provider found

## 2020-01-16 LAB — RESPIRATORY ALLERGY PROFILE REGION II ~~LOC~~
Allergen, A. alternata, m6: 0.75 kU/L — ABNORMAL HIGH
Allergen, Cedar tree, t12: 6.49 kU/L — ABNORMAL HIGH
Allergen, Comm Silver Birch, t9: 16.2 kU/L — ABNORMAL HIGH
Allergen, Cottonwood, t14: 9.96 kU/L — ABNORMAL HIGH
Allergen, D pternoyssinus,d7: 10.2 kU/L — ABNORMAL HIGH
Allergen, Mouse Urine Protein, e78: <0.1 kU/L
Allergen, Mulberry, t76: 1.83 kU/L — ABNORMAL HIGH
Allergen, Oak,t7: 14.2 kU/L — ABNORMAL HIGH
Allergen, P. notatum, m1: 0.22 kU/L — ABNORMAL HIGH
Aspergillus fumigatus, m3: 8.96 kU/L — ABNORMAL HIGH
Bermuda Grass: 7.44 kU/L — ABNORMAL HIGH
Box Elder IgE: 5.98 kU/L — ABNORMAL HIGH
CLADOSPORIUM HERBARUM (M2) IGE: 0.69 kU/L — ABNORMAL HIGH
COMMON RAGWEED (SHORT) (W1) IGE: 23.8 kU/L — ABNORMAL HIGH
Cat Dander: 5.69 kU/L — ABNORMAL HIGH
Class: 0
Class: 1
Class: 1
Class: 2
Class: 2
Class: 3
Class: 3
Class: 3
Class: 3
Class: 3
Class: 3
Class: 3
Class: 3
Class: 3
Class: 3
Class: 3
Class: 3
Class: 3
Class: 3
Class: 4
Class: 4
Class: 4
Class: 4
Cockroach: 0.5 kU/L — ABNORMAL HIGH
D. farinae: 14.8 kU/L — ABNORMAL HIGH
Dog Dander: 40.3 kU/L — ABNORMAL HIGH
Elm IgE: 15.6 kU/L — ABNORMAL HIGH
IgE (Immunoglobulin E), Serum: 1390 kU/L — ABNORMAL HIGH (ref <=114)
Johnson Grass: 9.54 kU/L — ABNORMAL HIGH
Pecan/Hickory Tree IgE: 47.2 kU/L — ABNORMAL HIGH
Rough Pigweed  IgE: 5.28 kU/L — ABNORMAL HIGH
Sheep Sorrel IgE: 4.07 kU/L — ABNORMAL HIGH
Timothy Grass: 43.7 kU/L — ABNORMAL HIGH

## 2020-01-16 LAB — INTERPRETATION:

## 2020-01-19 ENCOUNTER — Telehealth: Payer: Self-pay | Admitting: Internal Medicine

## 2020-01-19 NOTE — Telephone Encounter (Signed)
Records faxed  [facility] for thept to be notified that this was done

## 2020-01-20 ENCOUNTER — Other Ambulatory Visit: Payer: Self-pay

## 2020-01-20 ENCOUNTER — Other Ambulatory Visit
Admission: RE | Admit: 2020-01-20 | Discharge: 2020-01-20 | Disposition: A | Discharge status: Home / Self Care | Payer: BLUE CROSS/BLUE SHIELD | Source: Ambulatory Visit | Attending: Internal Medicine | Admitting: Internal Medicine

## 2020-01-20 DIAGNOSIS — Z20822 Contact with and (suspected) exposure to covid-19: Secondary | ICD-10-CM | POA: Diagnosis not present

## 2020-01-20 DIAGNOSIS — Z01812 Encounter for preprocedural laboratory examination: Secondary | ICD-10-CM | POA: Insufficient documentation

## 2020-01-21 LAB — SARS CORONAVIRUS 2 (TAT 6-24 HRS): SARS Coronavirus 2: NEGATIVE

## 2020-01-23 ENCOUNTER — Ambulatory Visit (INDEPENDENT_AMBULATORY_CARE_PROVIDER_SITE_OTHER): Payer: BLUE CROSS/BLUE SHIELD | Admitting: Internal Medicine

## 2020-01-23 ENCOUNTER — Other Ambulatory Visit: Payer: Self-pay | Admitting: *Deleted

## 2020-01-23 ENCOUNTER — Other Ambulatory Visit: Payer: Self-pay

## 2020-01-23 DIAGNOSIS — J4552 Severe persistent asthma with status asthmaticus: Secondary | ICD-10-CM | POA: Diagnosis not present

## 2020-01-23 LAB — PULMONARY FUNCTION TEST
FEF 25-75 Post: 1.88 L/sec
FEF 25-75 Pre: 1.84 L/sec
FEF2575-%Change-Post: 2 %
FEF2575-%Pred-Post: 44 %
FEF2575-%Pred-Pre: 43 %
FEV1-%Change-Post: 0 %
FEV1-%Pred-Post: 75 %
FEV1-%Pred-Pre: 75 %
FEV1-Post: 3.06 L
FEV1-Pre: 3.07 L
FEV1FVC-%Change-Post: -1 %
FEV1FVC-%Pred-Pre: 78 %
FEV6-%Change-Post: 2 %
FEV6-%Pred-Post: 99 %
FEV6-%Pred-Pre: 96 %
FEV6-Post: 4.79 L
FEV6-Pre: 4.67 L
FEV6FVC-%Change-Post: 1 %
FEV6FVC-%Pred-Post: 101 %
FEV6FVC-%Pred-Pre: 100 %
FVC-%Change-Post: 1 %
FVC-%Pred-Post: 97 %
FVC-%Pred-Pre: 96 %
FVC-Post: 4.8 L
FVC-Pre: 4.72 L
Post FEV1/FVC ratio: 64 %
Post FEV6/FVC ratio: 100 %
Pre FEV1/FVC ratio: 65 %
Pre FEV6/FVC Ratio: 99 %

## 2020-01-23 LAB — NITRIC OXIDE: 11800305105: 20

## 2020-01-23 NOTE — Progress Notes (Signed)
PFT completed today.  

## 2020-01-23 NOTE — Addendum Note (Signed)
Addended by: Jacquiline Doe on: 01/23/2020 02:02 PM   Modules accepted: Orders

## 2020-01-27 NOTE — Progress Notes (Signed)
Spirometry with mild airflow limitation - consistent with patient's diagnosis of asthma. Please call patient and let him know results and to follow up with [place] and with allergy as discussed.

## 2020-01-29 ENCOUNTER — Inpatient Hospital Stay: Payer: BLUE CROSS/BLUE SHIELD | Admitting: Internal Medicine

## 2020-02-06 NOTE — Progress Notes (Signed)
Patient identification verified, results of recent PFT reviewed.   Per Elisha Headland FNP, spirometry with mild airflow limitation consistent with patient's diagnosis of asthma. Please call patient and let him know results and to follow up with [place] and with allergy as discussed.   Patient verbalized understanding of results and ongoing plan of care.

## 2020-03-09 ENCOUNTER — Ambulatory Visit: Payer: Self-pay | Admitting: Allergy and Immunology

## 2020-04-06 ENCOUNTER — Telehealth: Payer: Self-pay | Admitting: Internal Medicine

## 2020-04-06 NOTE — Telephone Encounter (Signed)
Called number provided by pt which was for spouse Morrie Sheldon but unable to reach. Left message for her to return call.

## 2020-04-07 NOTE — Telephone Encounter (Signed)
LMTCB x2 for pt 

## 2020-04-08 NOTE — Telephone Encounter (Signed)
Called and spoke to pt's spouse. Pt questioning if the moderna or pfizer vaccine would be appropriate for the pt and his asthma. I confirmed that getting the vaccine from either manufacturer would be appropriate. Spouse verbalized understanding and denied any further questions or concerns at this time.

## 2020-11-08 IMAGING — DX DG CHEST 1V PORT
2 series · 2 of 2 positions shown · non-contrast
Comparison: Chest x-ray from earlier same day.

CLINICAL DATA: Central line.

EXAM:
PORTABLE CHEST 1 VIEW

[chest ap (1 of 2)]
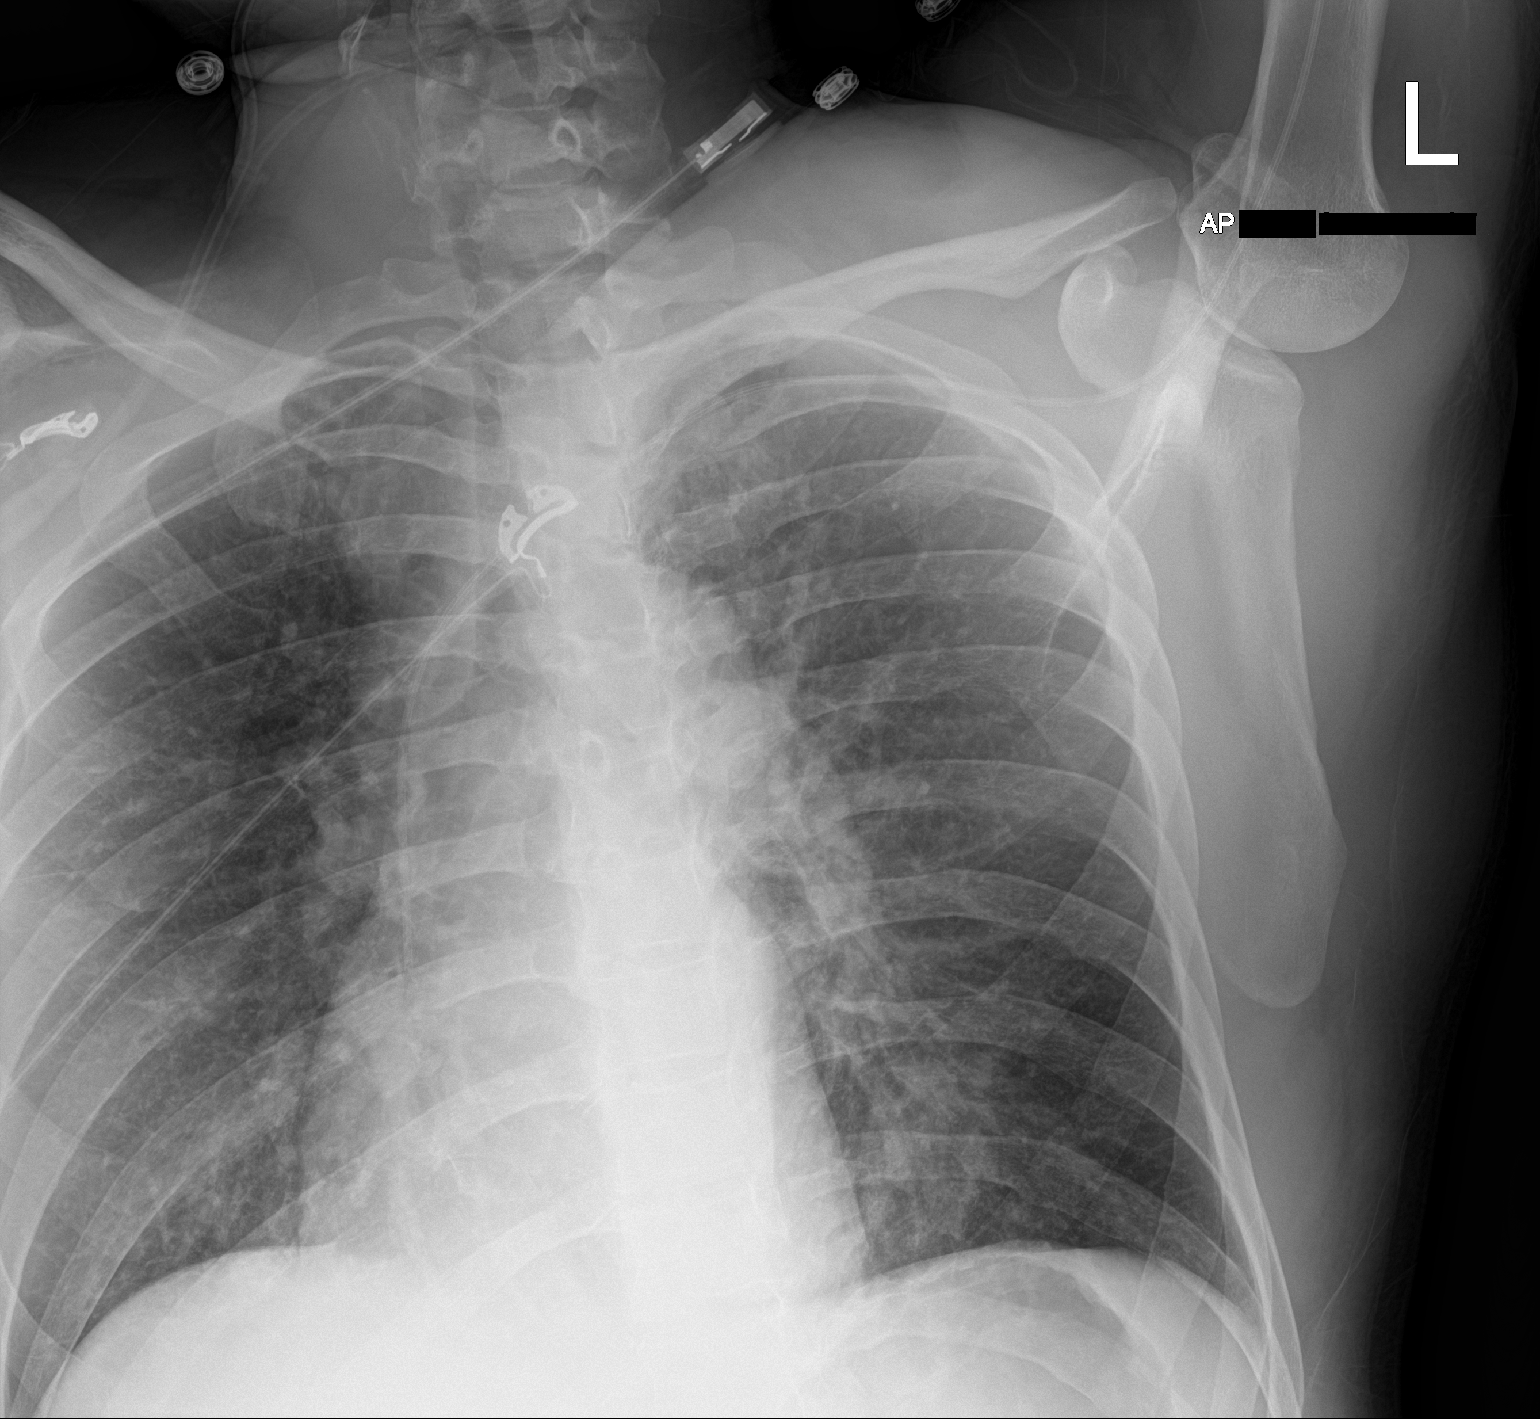

[chest ap (2 of 2)]
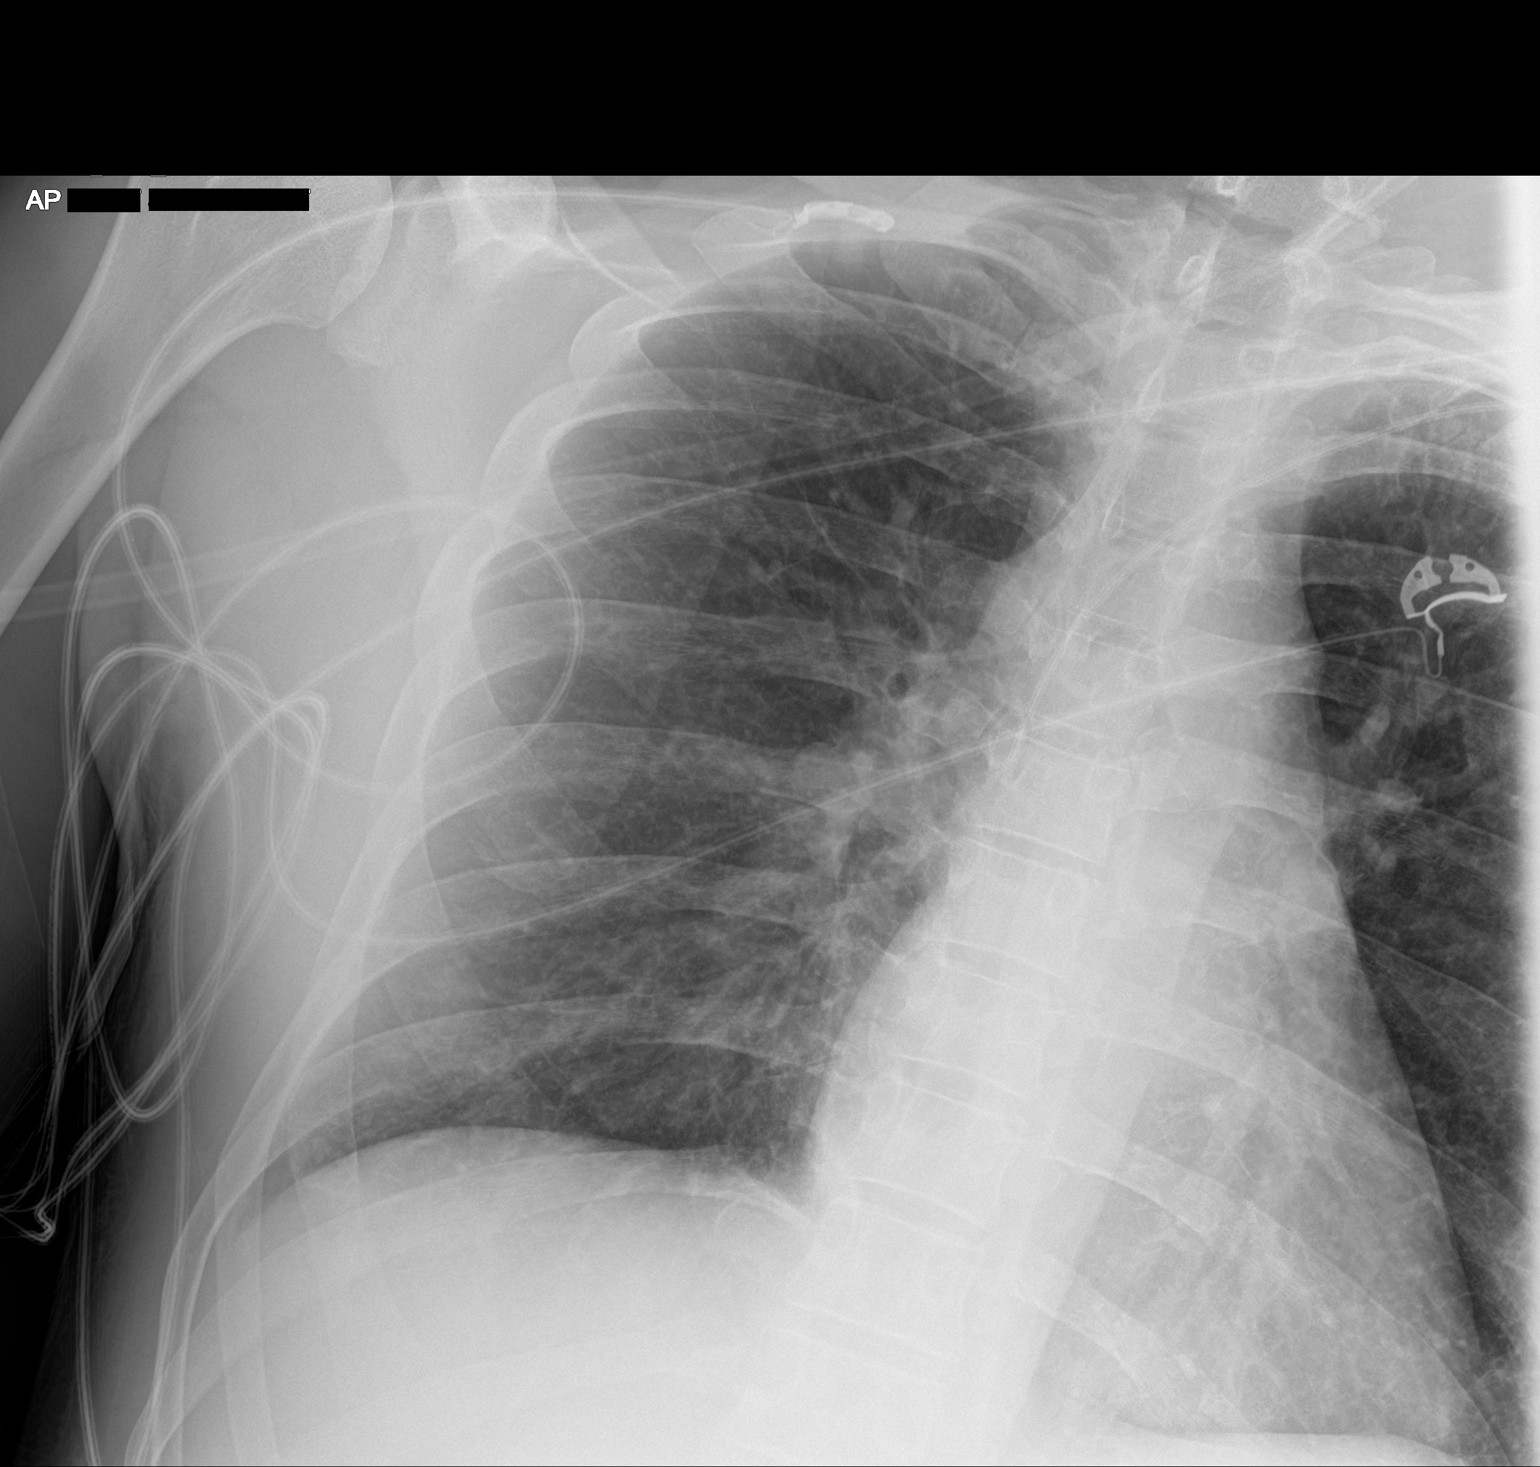

[2 of 2 positions shown; findings below may reference images not displayed]

FINDINGS: LEFT-sided PICC line has been advanced and now appears well
positioned with tip at the level of the lower SVC/cavoatrial
junction. Lungs are clear. No pleural effusion or pneumothorax is
seen.
IMPRESSION: LEFT-sided PICC line now appears well positioned with tip at the
level of the lower SVC/cavoatrial junction.

## 2020-11-08 IMAGING — DX DG CHEST 1V PORT
1 series · 1 of 1 positions shown · non-contrast
Comparison: January 08, 2020

CLINICAL DATA: Change in mental status

EXAM:
PORTABLE CHEST 1 VIEW

[chest]
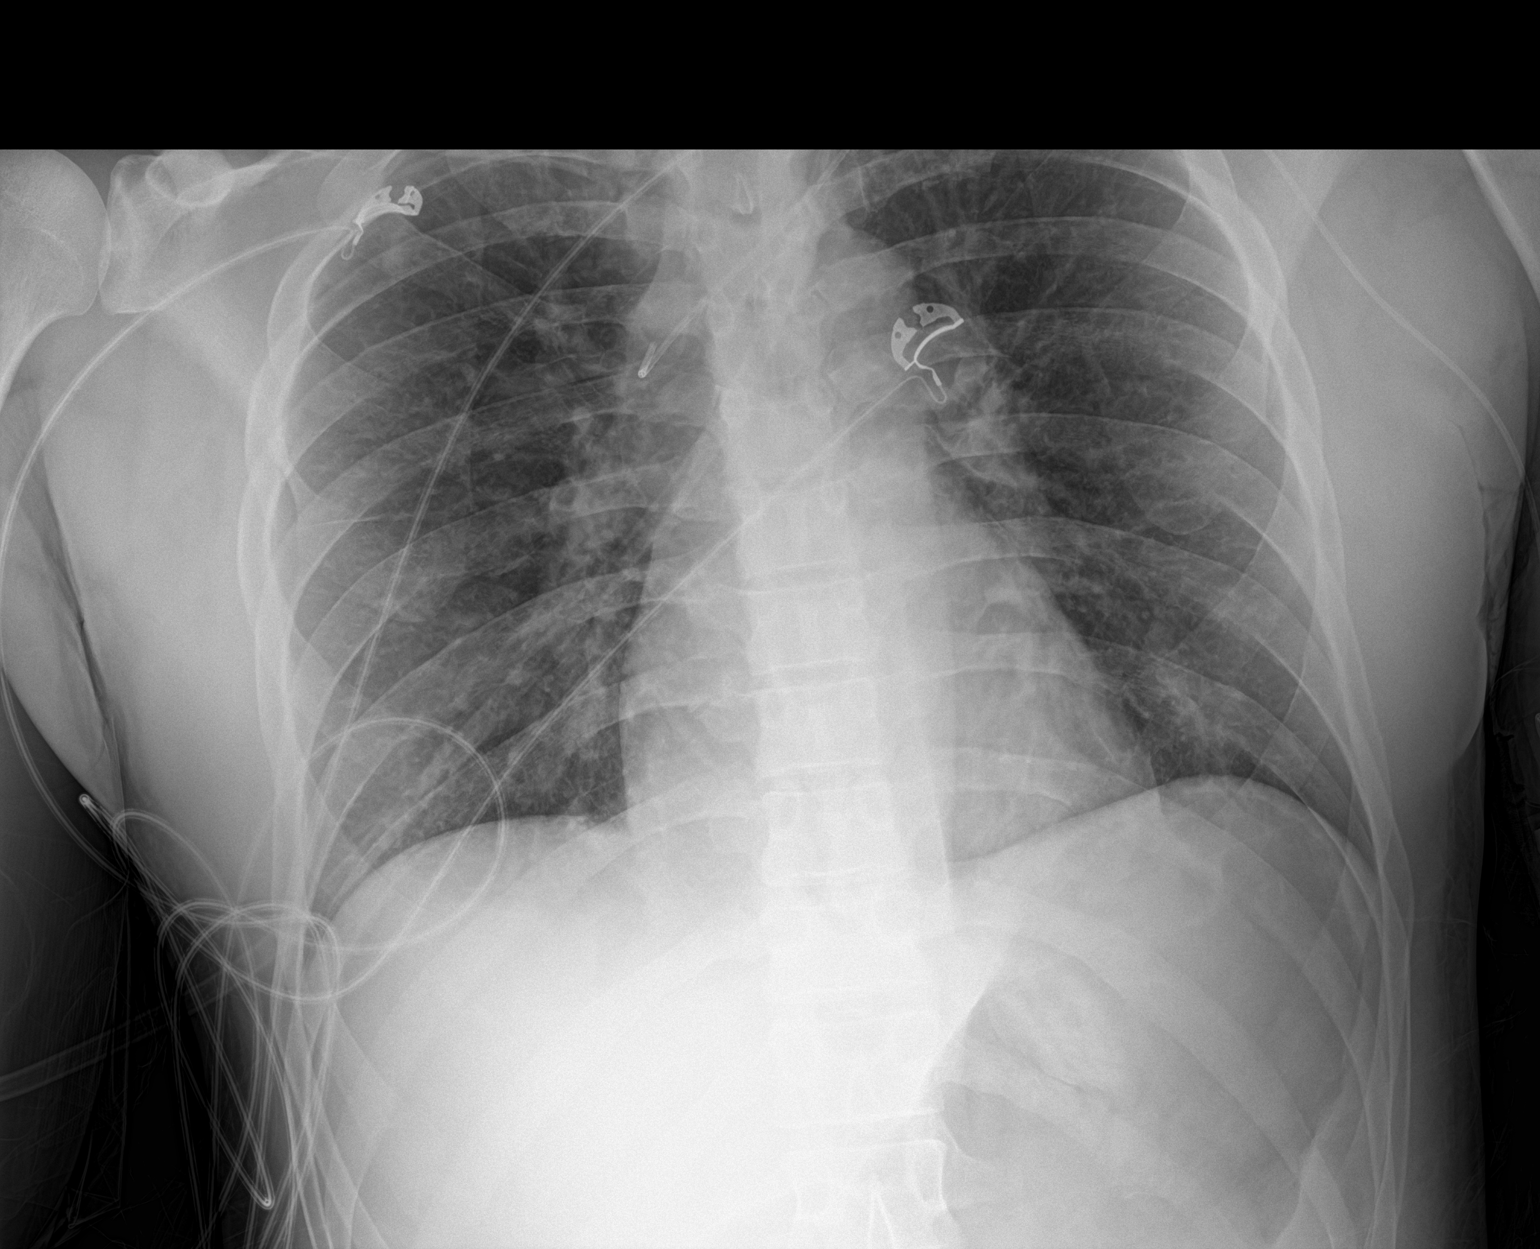

[1 of 1 positions shown; findings below may reference images not displayed]

FINDINGS: The heart size and mediastinal contours are within normal limits.
Both lungs are clear. The left-sided PICC appears to be pulled back
and the tip is curled upward in the upper SVC. No acute osseous
abnormality.
IMPRESSION: No active disease.

Left-sided PICC retracted with the tip curled upward in the upper
SVC.

These results will be called to the ordering clinician or
representative by the Radiologist Assistant, and communication
documented in the PACS or [REDACTED].

## 2021-03-15 ENCOUNTER — Other Ambulatory Visit: Payer: Self-pay | Admitting: Internal Medicine

## 2021-04-26 ENCOUNTER — Telehealth: Payer: Self-pay | Admitting: Internal Medicine

## 2021-04-26 MED ORDER — MOMETASONE FURO-FORMOTEROL FUM 100-5 MCG/ACT IN AERO: 2.0000 | INHALATION_SPRAY | Freq: Two times a day (BID) | RESPIRATORY_TRACT | 0 refills | Status: DC

## 2021-04-26 NOTE — Telephone Encounter (Signed)
Call made to patient, confirmed DOB. Patient is requesting a refill of dulera. I made him aware we could send in a 30 day refill but he will need to make an appt for further refills. Appt made. 30 day supply sent in. Patient states disregard the medication being expensive as all the other alternative he has tried do not work.   Nothing further needed at this time.

## 2021-05-11 ENCOUNTER — Encounter: Payer: Self-pay | Admitting: Internal Medicine

## 2021-05-11 ENCOUNTER — Other Ambulatory Visit: Payer: Self-pay

## 2021-05-11 ENCOUNTER — Ambulatory Visit (INDEPENDENT_AMBULATORY_CARE_PROVIDER_SITE_OTHER): Payer: Self-pay | Admitting: Internal Medicine

## 2021-05-11 VITALS — BP 124/76 | HR 72 | Temp 97.9°F | Ht 73.0 in | Wt 192.6 lb

## 2021-05-11 DIAGNOSIS — J301 Allergic rhinitis due to pollen: Secondary | ICD-10-CM

## 2021-05-11 DIAGNOSIS — J4550 Severe persistent asthma, uncomplicated: Secondary | ICD-10-CM

## 2021-05-11 MED ORDER — FLUTICASONE PROPIONATE 50 MCG/ACT NA SUSP: 1.0000 | Freq: Every day | NASAL | 5 refills | Status: DC

## 2021-05-11 MED ORDER — ALBUTEROL SULFATE HFA 108 (90 BASE) MCG/ACT IN AERS: 2.0000 | INHALATION_SPRAY | Freq: Four times a day (QID) | RESPIRATORY_TRACT | 11 refills | Status: DC | PRN

## 2021-05-11 MED ORDER — MOMETASONE FURO-FORMOTEROL FUM 100-5 MCG/ACT IN AERO: 1.0000 | INHALATION_SPRAY | Freq: Two times a day (BID) | RESPIRATORY_TRACT | 11 refills | Status: DC

## 2021-05-11 NOTE — Patient Instructions (Addendum)
Please schedule follow up scheduled with myself in 6 months.  If my schedule is not open yet, we will contact you with a reminder closer to that time.  Before your next visit I would like you to have:  Spirometry/Feno  Continue dulera ok to continue 1 puff twice a day.  Continue albuterol as needed.   Flonase - 1 spray on each side of your nose twice a day for first week, then 1 spray on each side.   Instructions for use: If you also use a saline nasal spray or rinse, use that first. Position the head with the chin slightly tucked. Use the right hand to spray into the left nostril and the right hand to spray into the left nostril.   Point the bottle away from the septum of your nose (cartilage that divides the two sides of your nose).  Hold the nostril closed on the opposite side from where you will spray Spray once and gently sniff to pull the medicine into the higher parts of your nose.  Don't sniff too hard as the medicine will drain down the back of your throat instead. Repeat with a second spray on the same side if prescribed. Repeat on the other side of your nose.

## 2021-05-11 NOTE — Progress Notes (Signed)
         Kimberley Dastrup    [id number]    Aug 25, 1981  Primary Care Physician:Patient, No Pcp Per (Inactive) Date of Appointment: 05/11/2021 Established Patient Visit  Chief complaint:  Follow up for asthma    HPI: Isaac Estrada is a 40 y.o. man with history of severe persistent asthma with history of intubation.   Interval Updates: Here for follow up today after being seen a year ago.  Ho hospitalizations or ED visits. Incruse giving him headaches so he stopped it.  Taking dulera 2 puffs BID - ran out for two weeks and was taking albuterol more frequently at that time   Current Regimen: dulera Asthma Triggers: multiple environmental allergens Exacerbations in the last year: none History of hospitalization or intubation: yes last intubated 2021.  Allergy Testing: yes. Has seen allergy GERD: none Allergic Rhinitis: yes takes cetirizine ACT:  Asthma Control Test ACT Total Score  05/11/2021 23  01/15/2020 16        I have reviewed the patient's family social and past medical history and updated as appropriate.   Past Medical History:  Diagnosis Date   Allergy    Asthma     History reviewed. No pertinent surgical history.  Family History  Problem Relation Age of Onset   Diabetes Mother    Asthma Father    Asthma Sister    Asthma Daughter    Asthma Son    Asthma Daughter     Social History   Occupational History   Occupation: unemployed  Tobacco Use   Smoking status: Never   Smokeless tobacco: Never  Substance and Sexual Activity   Alcohol use: No   Drug use: No   Sexual activity: Yes     Physical Exam: Blood pressure 124/76, pulse 72, temperature 97.9 F (36.6 C), height 6\' 1"  (1.854 m), weight 192 lb 9.6 oz (87.4 kg), SpO2 98 %.  Gen:      No acute distress ENT:  no nasal polyps, mucus membranes moist Lungs:    No increased respiratory effort, symmetric chest wall excursion, clear to auscultation bilaterally, no wheezes or crackles CV:          Regular rate and rhythm; no murmurs, rubs, or gallops.  No pedal edema   Data Reviewed: Imaging: I have personally reviewed the chest xrays May 2021 - no acute process  PFTs:  PFT Results Latest Ref Rng & Units 01/23/2020  FVC-Pre L 4.72  FVC-Predicted Pre % 96  FVC-Post L 4.80  FVC-Predicted Post % 97  Pre FEV1/FVC % % 65  Post FEV1/FCV % % 64  FEV1-Pre L 3.07  FEV1-Predicted Pre % 75  FEV1-Post L 3.06   I have personally reviewed the patient's PFTs and mild airflow limitation no significant response to bronchodilator.   Labs:  Immunization status:  There is no immunization history on file for this patient.  Assessment:  Severe persistent asthma with history of intubation Allergic Rhinitis  Plan/Recommendations: Continue Dulera but can move down to 1 puff twice a day.  Will order spirometry and feno for next visit.  He declines flu shot.   Return to Care: Return in about 6 months (around 11/08/2021).   11/10/2021, MD Pulmonary and Critical Care Medicine [facility] Office:[phone]

## 2022-09-20 ENCOUNTER — Other Ambulatory Visit: Payer: Self-pay | Admitting: Internal Medicine

## 2023-01-09 ENCOUNTER — Encounter: Payer: Self-pay | Admitting: Internal Medicine

## 2023-01-09 ENCOUNTER — Ambulatory Visit (INDEPENDENT_AMBULATORY_CARE_PROVIDER_SITE_OTHER): Payer: Self-pay | Admitting: Internal Medicine

## 2023-01-09 VITALS — BP 118/70 | HR 75 | Temp 97.9°F | Ht 73.0 in | Wt 191.6 lb

## 2023-01-09 DIAGNOSIS — J4540 Moderate persistent asthma, uncomplicated: Secondary | ICD-10-CM

## 2023-01-09 MED ORDER — ALBUTEROL SULFATE HFA 108 (90 BASE) MCG/ACT IN AERS: 2.0000 | INHALATION_SPRAY | Freq: Four times a day (QID) | RESPIRATORY_TRACT | 5 refills | Status: AC | PRN

## 2023-01-09 MED ORDER — AIRDUO DIGIHALER 113-14 MCG/ACT IN AEPB: 1.0000 | INHALATION_SPRAY | Freq: Two times a day (BID) | RESPIRATORY_TRACT | 5 refills | Status: DC

## 2023-01-09 NOTE — Progress Notes (Signed)
Zaydenn Isgett    [id number]    02/20/1981  Primary Care Physician:Patient, No Pcp Per Date of Appointment: 01/09/2023 Established Patient Visit  Chief complaint:   Chief Complaint  Patient presents with   Follow-up    Pt states he has been doing well with his asthma      HPI: Kainon Dooney is a 42 y.o. man with history of severe persistent asthma with history of intubation.   Interval Updates: Here for follow up after 2 years. Has run out of dulera for the past two months.  No interval hospitalizations or ED visits  Allergies are controlled.  Going to get insurance in 2 weeks.   Current Regimen: Dulera 100 1 puff twice daily.  Asthma Triggers: multiple environmental allergens Exacerbations in the last year: none History of hospitalization or intubation: yes last intubated 2021.  Allergy Testing: yes. Has seen allergy GERD: none Allergic Rhinitis: yes takes cetirizine ACT:  Asthma Control Test ACT Total Score  01/09/2023  1:22 PM 21  05/11/2021  2:19 PM 23  01/15/2020  1:34 PM 16    I have reviewed the patient's family social and past medical history and updated as appropriate.   Past Medical History:  Diagnosis Date   Allergy    Asthma     History reviewed. No pertinent surgical history.  Family History  Problem Relation Age of Onset   Diabetes Mother    Asthma Father    Asthma Sister    Asthma Daughter    Asthma Son    Asthma Daughter     Social History   Occupational History   Occupation: unemployed  Tobacco Use   Smoking status: Never   Smokeless tobacco: Never  Substance and Sexual Activity   Alcohol use: No   Drug use: No   Sexual activity: Yes     Physical Exam: Blood pressure 118/70, pulse 75, temperature 97.9 F (36.6 C), height 6\' 1"  (1.854 m), weight 191 lb 9.6 oz (86.9 kg), SpO2 100 %.  Gen:      No acute distress Lungs:    ctab no wheezes or crackles CV:         RRR no mrg   Data Reviewed: Imaging: I have  personally reviewed the chest xrays May 2021 - no acute process  PFTs:     Latest Ref Rng & Units 01/23/2020    1:14 PM  PFT Results  FVC-Pre L 4.72   FVC-Predicted Pre % 96   FVC-Post L 4.80   FVC-Predicted Post % 97   Pre FEV1/FVC % % 65   Post FEV1/FCV % % 64   FEV1-Pre L 3.07   FEV1-Predicted Pre % 75   FEV1-Post L 3.06    I have personally reviewed the patient's PFTs and mild airflow limitation no significant response to bronchodilator.   Labs:  Immunization status:  There is no immunization history on file for this patient.  Assessment:  Severe persistent asthma with history of intubation, controlled Allergic Rhinitis, controlled  Plan/Recommendations: Will prescribe airduo with good rx coupon at [facility] - about $45. Please call me once you have insurance and let me know which the preferred inhaler is for your insurance. You don't need another appointment and I can just call it in for you.  Continue albuterol inhaler as needed.    Return to Care: Return in about 6 months (around 07/12/2023).   Durel Salts, MD Pulmonary and Critical Care Medicine [facility]  Office:[phone]

## 2023-01-09 NOTE — Patient Instructions (Signed)
Please schedule follow up scheduled with myself in 6 months.  If my schedule is not open yet, we will contact you with a reminder closer to that time. Please call [phone] if you haven't heard from [place] a month before.   Will prescribe airduo with good rx coupon at [facility] - about $45. Please call me once you have insurance and let me know which the preferred inhaler is for your insurance. You don't need another appointment and I can just call it in for you.  Continue albuterol inhaler as needed.

## 2023-02-09 ENCOUNTER — Encounter: Payer: Self-pay | Admitting: Internal Medicine

## 2023-02-15 MED ORDER — MOMETASONE FURO-FORMOTEROL FUM 100-5 MCG/ACT IN AERO: 2.0000 | INHALATION_SPRAY | Freq: Two times a day (BID) | RESPIRATORY_TRACT | 11 refills | Status: AC

## 2023-02-15 NOTE — Telephone Encounter (Signed)
Dr Craige Cotta, the pt walked in asking about his inhaler   Airduo was too expensive, and he would like to go back to [facility] 100   Dr Celine Mans is out of the office until next wk and pt asking for answer before then so he is not without any med   Please advise, thank you!

## 2023-02-15 NOTE — Telephone Encounter (Signed)
Okay to send script for dulera 100 two puffs bid.

## 2023-10-10 ENCOUNTER — Encounter: Payer: Self-pay | Admitting: Family

## 2023-10-10 ENCOUNTER — Ambulatory Visit: Payer: BC Managed Care – PPO | Admitting: Family

## 2023-10-10 VITALS — BP 104/72 | HR 79 | Temp 98.0°F | Ht 73.0 in | Wt 180.6 lb

## 2023-10-10 DIAGNOSIS — Z0000 Encounter for general adult medical examination without abnormal findings: Secondary | ICD-10-CM | POA: Insufficient documentation

## 2023-10-10 DIAGNOSIS — Z13220 Encounter for screening for lipoid disorders: Secondary | ICD-10-CM

## 2023-10-10 DIAGNOSIS — Z23 Encounter for immunization: Secondary | ICD-10-CM

## 2023-10-10 DIAGNOSIS — J3089 Other allergic rhinitis: Secondary | ICD-10-CM

## 2023-10-10 DIAGNOSIS — Z1159 Encounter for screening for other viral diseases: Secondary | ICD-10-CM | POA: Diagnosis not present

## 2023-10-10 DIAGNOSIS — J4540 Moderate persistent asthma, uncomplicated: Secondary | ICD-10-CM | POA: Diagnosis not present

## 2023-10-10 DIAGNOSIS — J302 Other seasonal allergic rhinitis: Secondary | ICD-10-CM

## 2023-10-10 DIAGNOSIS — R351 Nocturia: Secondary | ICD-10-CM

## 2023-10-10 DIAGNOSIS — M62838 Other muscle spasm: Secondary | ICD-10-CM | POA: Insufficient documentation

## 2023-10-10 LAB — CBC
HCT: 42.3 % (ref 39.0–52.0)
Hemoglobin: 14.2 g/dL (ref 13.0–17.0)
MCHC: 33.5 g/dL (ref 30.0–36.0)
MCV: 83.3 fL (ref 78.0–100.0)
Platelets: 301 10*3/uL (ref 150.0–400.0)
RBC: 5.08 Mil/uL (ref 4.22–5.81)
RDW: 13.7 % (ref 11.5–15.5)
WBC: 3.9 10*3/uL — ABNORMAL LOW (ref 4.0–10.5)

## 2023-10-10 LAB — LIPID PANEL
Cholesterol: 195 mg/dL (ref 0–200)
HDL: 42.5 mg/dL (ref >39.00)
LDL Cholesterol: 132 mg/dL — ABNORMAL HIGH (ref 0–99)
NonHDL: 152.49
Total CHOL/HDL Ratio: 5
Triglycerides: 103 mg/dL (ref 0.0–149.0)
VLDL: 20.6 mg/dL (ref 0.0–40.0)

## 2023-10-10 LAB — BASIC METABOLIC PANEL
BUN: 13 mg/dL (ref 6–23)
CO2: 30 meq/L (ref 19–32)
Calcium: 9.5 mg/dL (ref 8.4–10.5)
Chloride: 102 meq/L (ref 96–112)
Creatinine, Ser: 1.21 mg/dL (ref 0.40–1.50)
GFR: 75.1 mL/min (ref >60.00)
Glucose, Bld: 103 mg/dL — ABNORMAL HIGH (ref 70–99)
Potassium: 4.8 meq/L (ref 3.5–5.1)
Sodium: 140 meq/L (ref 135–145)

## 2023-10-10 LAB — PSA: PSA: 0.93 ng/mL (ref 0.10–4.00)

## 2023-10-10 MED ORDER — TIZANIDINE HCL 4 MG PO TABS: ORAL_TABLET | ORAL | 0 refills | Status: AC

## 2023-10-10 NOTE — Progress Notes (Signed)
Subjective:  Patient ID: Isaac Estrada, male    DOB: 17-Feb-1981  Age: 43 y.o. MRN: [id number]  Patient Care Team: Mort Sawyers, FNP as PCP - General (Family Medicine)   CC:  Chief Complaint  Patient presents with   Establish Care    HPI Isaac Estrada is a 43 y.o. male who presents today for an annual physical exam. He reports consuming a general diet.  Regular exercise, calistenic workouts and runs at times  He generally feels well. He reports sleeping fairly well. He does not have additional problems to discuss today.   Vision:Not within last year Dental:Receives regular dental care  Pt is without acute concerns.   At times will notice a 'twinge in his right lower back' usually from working out or exercising. Doesn't always stretch with exercise. He states when it occurs worse with movement. He also thinks that it might be his mattress that is memory foam. Not currently in a flare.   Pt does state at times he will pee more often at night time. He thinks its mainly because he drinks a lot of water during the day but he is worried  Because his father was just dx with prostate cancer.  He denies urinary retention and or difficulty starting a stream of urine.   Advanced Directives Patient does not have advanced directives    DEPRESSION SCREENING    10/10/2023    9:08 AM 06/16/2019    4:56 PM 11/11/2015    9:21 AM  PHQ 2/9 Scores  PHQ - 2 Score 0 0 0  PHQ- 9 Score 3       ROS: Negative unless specifically indicated above in HPI.    Current Outpatient Medications:    albuterol (PROVENTIL) (2.5 MG/3ML) 0.083% nebulizer solution, Take 3 mLs (2.5 mg total) by nebulization every 6 (six) hours as needed for wheezing or shortness of breath., Disp: 75 mL, Rfl: 12   albuterol (VENTOLIN HFA) 108 (90 Base) MCG/ACT inhaler, Inhale 2 puffs into the lungs every 6 (six) hours as needed for wheezing or shortness of breath., Disp: 18 g, Rfl: 5   mometasone-formoterol (DULERA) 100-5 MCG/ACT  AERO, Inhale 2 puffs into the lungs in the morning and at bedtime., Disp: 1 each, Rfl: 11   tiZANidine (ZANAFLEX) 4 MG tablet, Take 1/2 to one tablet po at bedtime prn muscle spasm, Disp: 30 tablet, Rfl: 0    Objective:    BP 104/72 (BP Location: Left Arm, Patient Position: Sitting, Cuff Size: Normal)   Pulse 79   Temp 98 F (36.7 C) (Temporal)   Ht 6\' 1"  (1.854 m)   Wt 180 lb 9.6 oz (81.9 kg)   SpO2 98%   BMI 23.83 kg/m   BP Readings from Last 3 Encounters:  10/10/23 104/72  01/09/23 118/70  05/11/21 124/76      Physical Exam Vitals reviewed.  Constitutional:      General: He is not in acute distress.    Appearance: Normal appearance. He is normal weight. He is not ill-appearing, toxic-appearing or diaphoretic.  HENT:     Head: Normocephalic.     Right Ear: Tympanic membrane normal.     Left Ear: Tympanic membrane normal.     Nose: Nose normal.     Mouth/Throat:     Mouth: Mucous membranes are moist.  Eyes:     Pupils: Pupils are equal, round, and reactive to light.  Cardiovascular:     Rate and Rhythm: Normal rate and regular rhythm.  Pulmonary:     Effort: Pulmonary effort is normal.     Breath sounds: Normal breath sounds.  Abdominal:     General: Abdomen is flat.     Tenderness: There is no abdominal tenderness.  Musculoskeletal:        General: Normal range of motion.  Skin:    General: Skin is warm.  Neurological:     General: No focal deficit present.     Mental Status: He is alert and oriented to person, place, and time. Mental status is at baseline.  Psychiatric:        Mood and Affect: Mood normal.        Behavior: Behavior normal.        Thought Content: Thought content normal.        Judgment: Judgment normal.          Assessment & Plan:  Moderate persistent asthma without complication Assessment & Plan: Stable Pt doing well on dulera    Perennial allergic rhinitis with seasonal variation Assessment & Plan: Stable and controlled at  current   Encounter for general adult medical examination without abnormal findings Assessment & Plan: Patient Counseling(The following topics were reviewed):  Preventative care handout given to pt  Health maintenance and immunizations reviewed. Please refer to Health maintenance section. Pt advised on safe sex, wearing seatbelts in car, and proper nutrition labwork ordered today for annual Dental health: Discussed importance of regular tooth brushing, flossing, and dental visits.   Orders: -     Lipid panel -     Basic metabolic panel -     CBC  Screening for lipoid disorders -     Lipid panel  Nocturia -     PSA  Encounter for hepatitis C screening test for low risk patient -     Hepatitis C antibody  Muscle spasm Assessment & Plan: Suspect lack of stretching vs bed Tizanidine prn  Heat/nsaids prn   Orders: -     tiZANidine HCl; Take 1/2 to one tablet po at bedtime prn muscle spasm  Dispense: 30 tablet; Refill: 0  Other orders -     Tdap vaccine greater than or equal to 7yo IM      Follow-up: Return in about 1 year (around 10/09/2024) for f/u CPE.   Mort Sawyers, FNP

## 2023-10-10 NOTE — Assessment & Plan Note (Signed)
Stable and controlled at current

## 2023-10-10 NOTE — Assessment & Plan Note (Signed)
Stable Pt doing well on dulera

## 2023-10-10 NOTE — Assessment & Plan Note (Signed)

## 2023-10-10 NOTE — Assessment & Plan Note (Signed)
Suspect lack of stretching vs bed Tizanidine prn  Heat/nsaids prn

## 2023-10-11 ENCOUNTER — Other Ambulatory Visit: Payer: Self-pay | Admitting: Family

## 2023-10-11 ENCOUNTER — Encounter: Payer: Self-pay | Admitting: Family

## 2023-10-11 DIAGNOSIS — D72819 Decreased white blood cell count, unspecified: Secondary | ICD-10-CM

## 2023-10-11 LAB — HEPATITIS C ANTIBODY: Hepatitis C Ab: NONREACTIVE

## 2023-10-12 ENCOUNTER — Encounter: Payer: Self-pay | Admitting: Family

## 2023-10-29 ENCOUNTER — Encounter: Payer: Self-pay | Admitting: Family

## 2023-10-29 ENCOUNTER — Ambulatory Visit: Admitting: Family

## 2023-10-29 VITALS — BP 116/72 | HR 76 | Temp 97.7°F | Ht 73.0 in | Wt 183.8 lb

## 2023-10-29 DIAGNOSIS — T148XXA Other injury of unspecified body region, initial encounter: Secondary | ICD-10-CM | POA: Insufficient documentation

## 2023-10-29 DIAGNOSIS — D72819 Decreased white blood cell count, unspecified: Secondary | ICD-10-CM

## 2023-10-29 LAB — CBC WITH DIFFERENTIAL/PLATELET
Basophils Absolute: 0 10*3/uL (ref 0.0–0.1)
Basophils Relative: 0.7 % (ref 0.0–3.0)
Eosinophils Absolute: 0.4 10*3/uL (ref 0.0–0.7)
Eosinophils Relative: 9.1 % — ABNORMAL HIGH (ref 0.0–5.0)
HCT: 40.4 % (ref 39.0–52.0)
Hemoglobin: 13.1 g/dL (ref 13.0–17.0)
Lymphocytes Relative: 36.1 % (ref 12.0–46.0)
Lymphs Abs: 1.4 10*3/uL (ref 0.7–4.0)
MCHC: 32.3 g/dL (ref 30.0–36.0)
MCV: 83.5 fl (ref 78.0–100.0)
Monocytes Absolute: 0.3 10*3/uL (ref 0.1–1.0)
Monocytes Relative: 8.5 % (ref 3.0–12.0)
Neutro Abs: 1.8 10*3/uL (ref 1.4–7.7)
Neutrophils Relative %: 45.6 % (ref 43.0–77.0)
Platelets: 269 10*3/uL (ref 150.0–400.0)
RBC: 4.84 Mil/uL (ref 4.22–5.81)
RDW: 13.6 % (ref 11.5–15.5)
WBC: 4 10*3/uL (ref 4.0–10.5)

## 2023-10-29 NOTE — Progress Notes (Signed)
 Established Patient Office Visit  Subjective:   Patient ID: Isaac Estrada, male    DOB: 1981/01/04  Age: 43 y.o. MRN: [id number]  CC:  Chief Complaint  Patient presents with   Acute Visit    Left sided pain in rib area when turning    HPI: Isaac Estrada is a 43 y.o. male presenting on 10/29/2023 for Acute Visit (Left sided pain in rib area when turning)  Was moving something heavy the other day and when he twisted to the left side he felt something 'twist' in his left side. He had rested, and then woke up and felt a bit more tight. Felt slightly better for a day but it is intermittent. He then retweaked it twisting off of the bleachers, and three days was hard to even walk and breathing was tender as well. He states he would even feel some numbness on his left side. Worse with movement.   Has bene taking ibuprofen and also tizanidine. He states feeling slightly better but 'retweaks' it depending on what he's doing.he states breathing and pain has greatly improved. He will feel tightness in his muscle.        ROS: Negative unless specifically indicated above in HPI.   Relevant past medical history reviewed and updated as indicated.   Allergies and medications reviewed and updated.   Current Outpatient Medications:    albuterol (PROVENTIL) (2.5 MG/3ML) 0.083% nebulizer solution, Take 3 mLs (2.5 mg total) by nebulization every 6 (six) hours as needed for wheezing or shortness of breath., Disp: 75 mL, Rfl: 12   albuterol (VENTOLIN HFA) 108 (90 Base) MCG/ACT inhaler, Inhale 2 puffs into the lungs every 6 (six) hours as needed for wheezing or shortness of breath., Disp: 18 g, Rfl: 5   mometasone-formoterol (DULERA) 100-5 MCG/ACT AERO, Inhale 2 puffs into the lungs in the morning and at bedtime., Disp: 1 each, Rfl: 11   tiZANidine (ZANAFLEX) 4 MG tablet, Take 1/2 to one tablet po at bedtime prn muscle spasm, Disp: 30 tablet, Rfl: 0  Allergies  Allergen Reactions   Peanut-Containing  Drug Products Shortness Of Breath and Swelling    Objective:   BP 116/72 (BP Location: Left Arm, Patient Position: Sitting, Cuff Size: Normal)   Pulse 76   Temp 97.7 F (36.5 C) (Temporal)   Ht 6\' 1"  (1.854 m)   Wt 183 lb 12.8 oz (83.4 kg)   SpO2 95%   BMI 24.25 kg/m    Physical Exam Constitutional:      General: He is not in acute distress.    Appearance: Normal appearance. He is normal weight. He is not ill-appearing, toxic-appearing or diaphoretic.  Cardiovascular:     Rate and Rhythm: Normal rate and regular rhythm.  Pulmonary:     Effort: Pulmonary effort is normal.     Breath sounds: Normal breath sounds.  Chest:     Comments: There is slight lateral chest wall upper back with suppleness and muscle spasm. Musculoskeletal:        General: Normal range of motion.  Neurological:     General: No focal deficit present.     Mental Status: He is alert and oriented to person, place, and time. Mental status is at baseline.  Psychiatric:        Mood and Affect: Mood normal.        Behavior: Behavior normal.        Thought Content: Thought content normal.        Judgment: Judgment normal.  Assessment & Plan:  Muscle strain Assessment & Plan: Slowly improving.  Advised to continue tizanidine as needed as well as stretching exercises, massage, and heat as needed.  Did advise also can apply lidocaine patch.  If needed.   Leukopenia, unspecified type -     CBC with Differential/Platelet     Follow up plan: Return if symptoms worsen or fail to improve.  Mort Sawyers, FNP

## 2023-10-29 NOTE — Assessment & Plan Note (Signed)
 Slowly improving.  Advised to continue tizanidine as needed as well as stretching exercises, massage, and heat as needed.  Did advise also can apply lidocaine patch.  If needed.

## 2023-11-02 ENCOUNTER — Other Ambulatory Visit: Payer: Self-pay | Admitting: Family

## 2023-11-02 DIAGNOSIS — M62838 Other muscle spasm: Secondary | ICD-10-CM
